# Patient Record
Sex: Female | Born: 1995 | Race: White | Hispanic: Yes | Marital: Married | Smoking: Never smoker
Health system: Southern US, Community
[De-identification: ages and names within clinical notes are randomized; demographics above are authoritative.]

## PROBLEM LIST (undated history)

## (undated) DIAGNOSIS — Z98891 History of uterine scar from previous surgery: Secondary | ICD-10-CM

## (undated) DIAGNOSIS — E58 Dietary calcium deficiency: Secondary | ICD-10-CM

## (undated) DIAGNOSIS — R0689 Other abnormalities of breathing: Secondary | ICD-10-CM

## (undated) DIAGNOSIS — O24419 Gestational diabetes mellitus in pregnancy, unspecified control: Secondary | ICD-10-CM

---

## 2015-03-16 DIAGNOSIS — Z98891 History of uterine scar from previous surgery: Secondary | ICD-10-CM | POA: Insufficient documentation

## 2018-01-25 ENCOUNTER — Inpatient Hospital Stay (HOSPITAL_BASED_OUTPATIENT_CLINIC_OR_DEPARTMENT_OTHER): Payer: Self-pay

## 2018-01-25 ENCOUNTER — Encounter (HOSPITAL_COMMUNITY): Payer: Self-pay

## 2018-01-25 ENCOUNTER — Inpatient Hospital Stay (HOSPITAL_COMMUNITY)
Admission: AD | Admit: 2018-01-25 | Discharge: 2018-01-25 | Disposition: A | Discharge status: Home / Self Care | Payer: Self-pay | Source: Ambulatory Visit | Attending: Obstetrics & Gynecology | Admitting: Obstetrics & Gynecology

## 2018-01-25 DIAGNOSIS — O34219 Maternal care for unspecified type scar from previous cesarean delivery: Secondary | ICD-10-CM

## 2018-01-25 DIAGNOSIS — O368130 Decreased fetal movements, third trimester, not applicable or unspecified: Secondary | ICD-10-CM

## 2018-01-25 DIAGNOSIS — Z3A38 38 weeks gestation of pregnancy: Secondary | ICD-10-CM

## 2018-01-25 DIAGNOSIS — Z363 Encounter for antenatal screening for malformations: Secondary | ICD-10-CM

## 2018-01-25 DIAGNOSIS — O368190 Decreased fetal movements, unspecified trimester, not applicable or unspecified: Secondary | ICD-10-CM

## 2018-01-25 DIAGNOSIS — O0930 Supervision of pregnancy with insufficient antenatal care, unspecified trimester: Secondary | ICD-10-CM

## 2018-01-25 DIAGNOSIS — O0933 Supervision of pregnancy with insufficient antenatal care, third trimester: Secondary | ICD-10-CM

## 2018-01-25 HISTORY — DX: Dietary calcium deficiency: E58

## 2018-01-25 LAB — URINALYSIS, ROUTINE W REFLEX MICROSCOPIC
Bilirubin Urine: NEGATIVE
GLUCOSE, UA: NEGATIVE mg/dL
Hgb urine dipstick: NEGATIVE
Ketones, ur: NEGATIVE mg/dL
Nitrite: NEGATIVE
Protein, ur: NEGATIVE mg/dL
SPECIFIC GRAVITY, URINE: 1.01 (ref 1.005–1.030)
pH: 7 (ref 5.0–8.0)

## 2018-01-25 LAB — OB RESULTS CONSOLE GBS: STREP GROUP B AG: NEGATIVE

## 2018-01-25 MED ORDER — SALINE SPRAY 0.65 % NA SOLN
1.0000 | NASAL | Status: DC | PRN
  Administered 2018-01-25: 1 via NASAL
  Filled 2018-01-25: qty 44

## 2018-01-25 NOTE — MAU Note (Signed)
Pt with dec FM x past 3 days. Pt was seen in another hospital 3 days ago with dec FM and cxts in [place] (pt unsure of hospital name). No vag bldg or watery leakage. Pain 5/10 in lower abd and lower back.   Adah Perlhandra Zoe Creasman RN

## 2018-01-25 NOTE — Discharge Instructions (Signed)
Evaluacin de los movimientos fetales  Fetal Movement Counts  Introduccin  Nombre del paciente: ________________________________________________ Fecha de parto estimada: ____________________  Qu es una evaluacin de los movimientos fetales?  Una evaluacin de los movimientos fetales es el registro del nmero de veces que siente que el beb se mueve durante un cierto perodo de tiempo. Esto tambin se puede denominar recuento de patadas fetales. Una evaluacin de movimientos fetales se recomienda a todas las embarazadas. Es posible que le indiquen que comience a evaluar los movimientos fetales desde la semana 28 de embarazo.  Preste atencin cuando sienta que el beb est ms activo. Podr detectar los ciclos en que el beb duerme y est despierto. Tambin podr detectar que ciertas cosas hacen que su beb se mueva ms. Deber realizar una evaluacin de los movimientos fetales en las siguientes situaciones:   Cuando el beb est ms activo habitualmente.   A la misma hora, todos los das.    Un buen momento para evaluar los movimientos fetales es cuando est descansando, despus de haber comido y bebido algo.  Cmo debo contar los movimientos fetales?  1. Encuentre un lugar tranquilo y cmodo. Sintese o acustese de lado.  2. Anote la fecha, la hora de inicio y de finalizacin y la cantidad de movimientos que sinti entre esas dos horas. Lleve esta informacin a las visitas de control.  3. Cuente las pataditas, revoloteos, chasquidos, vueltas o pinchazos en un perodo de 2horas. Debe sentir al menos 10movimientos en 2horas.  4. Cuando sienta 10movimientos, puede dejar de contar.  5. Si no siente 10movimientos en 2horas, coma y beba algo. Luego, contine descansando y contando durante 1hora. Si siente al menos 4movimientos durante esa hora, puede dejar de contar.  Comunquese con un mdico si:   Siente menos de 4movimientos en 2horas.   El beb no se mueve tanto como suele hacerlo.  Fecha:  ____________ Hora de inicio: ____________ Hora de finalizacin: ____________ Movimientos: ____________  Fecha: ____________ Hora de inicio: ____________ Hora de finalizacin: ____________ Movimientos: ____________  Fecha: ____________ Hora de inicio: ____________ Hora de finalizacin: ____________ Movimientos: ____________  Fecha: ____________ Hora de inicio: ____________ Hora de finalizacin: ____________ Movimientos: ____________  Fecha: ____________ Hora de inicio: ____________ Hora de finalizacin: ____________ Movimientos: ____________  Fecha: ____________ Hora de inicio: ____________ Hora de finalizacin: ____________ Movimientos: ____________  Fecha: ____________ Hora de inicio: ____________ Hora de finalizacin: ____________ Movimientos: ____________  Fecha: ____________ Hora de inicio: ____________ Hora de finalizacin: ____________ Movimientos: ____________  Fecha: ____________ Hora de inicio: ____________ Hora de finalizacin: ____________ Movimientos: ____________  Esta informacin no tiene como fin reemplazar el consejo del mdico. Asegrese de hacerle al mdico cualquier pregunta que tenga.  Document Released: 06/08/2007 Document Revised: 06/04/2016 Document Reviewed: 04/10/2015  Elsevier Interactive Patient Education  2018 [employer]vier Inc.

## 2018-01-25 NOTE — MAU Provider Note (Signed)
History     CSN: [id number]  Arrival date and time: 01/25/18 [id number]   First Provider Initiated Contact with Patient 01/25/18 1715      Chief Complaint  Patient presents with  . Decreased Fetal Movement   G2P1001 @38 .5 wks here with decreased FM x2 days. She is feeling some FM. Denies VB, LOF, and reg ctx. Has not had any PNC. [place] about 3 mos ago and was arrested under immigration. Was released yesterday. She is now residing here in [place]. Reports first delivery was CS for low fluid.    OB History    Gravida  2   Para  1   Term  1   Preterm      AB      Living  1     SAB      TAB      Ectopic      Multiple      Live Births  1           Past Medical History:  Diagnosis Date  . Calcium deficiency     Past Surgical History:  Procedure Laterality Date  . CESAREAN SECTION      Family History  Problem Relation Age of Onset  . Diabetes Paternal Grandfather     Social History   Tobacco Use  . Smoking status: Never Smoker  . Smokeless tobacco: Never Used  Substance Use Topics  . Alcohol use: Never    Frequency: Never  . Drug use: Never    Allergies: Not on File  No medications prior to admission.    Review of Systems  HENT: Positive for congestion (nasal).   Gastrointestinal: Negative for abdominal pain.  Genitourinary: Negative for vaginal bleeding and vaginal discharge.   Physical Exam   Blood pressure 129/68, pulse 99, temperature 97.9 F (36.6 C), temperature source Oral, resp. rate 16, last menstrual period 04/29/2017, SpO2 99 %.  Physical Exam  Constitutional: She is oriented to person, place, and time. She appears well-developed and well-nourished. No distress.  HENT:  Head: Normocephalic and atraumatic.  Neck: Normal range of motion.  Respiratory: Effort normal. No respiratory distress.  GI: Soft. She exhibits no distension. There is no tenderness.  gravid  Musculoskeletal: Normal range of motion.   Neurological: She is alert and oriented to person, place, and time.  Skin: Skin is warm and dry.  Psychiatric: She has a normal mood and affect.  EFM: 140 bpm, mod variability, + accels, no decels Toco: rare  Results for orders placed or performed during the hospital encounter of 01/25/18 (from the past 24 hour(s))  Urinalysis, Routine w reflex microscopic     Status: Abnormal   Collection Time: 01/25/18  4:49 PM  Result Value Ref Range   Color, Urine YELLOW YELLOW   APPearance CLOUDY (A) CLEAR   Specific Gravity, Urine 1.010 1.005 - 1.030   pH 7.0 5.0 - 8.0   Glucose, UA NEGATIVE NEGATIVE mg/dL   Hgb urine dipstick NEGATIVE NEGATIVE   Bilirubin Urine NEGATIVE NEGATIVE   Ketones, ur NEGATIVE NEGATIVE mg/dL   Protein, ur NEGATIVE NEGATIVE mg/dL   Nitrite NEGATIVE NEGATIVE   Leukocytes, UA TRACE (A) NEGATIVE   RBC / HPF 0-5 0 - 5 RBC/hpf   WBC, UA 6-10 0 - 5 WBC/hpf   Bacteria, UA RARE (A) NONE SEEN   Squamous Epithelial / LPF 0-5 0 - 5   Amorphous Crystal PRESENT    MAU Course  Procedures  MDM BPP 8/8, AFI 14cm. Normal anatomy. Measuring 35.6 by [place] discordant with LMP dating. Pt is sure of LMP. Discussed need for records specifically Op note to determine best MOD. Release of information signed. Feeling more FM now, FM visualized. Stable for discharge home.   Assessment and Plan   1. [redacted] weeks gestation of pregnancy   2. Decreased fetal movement   3. No prenatal care in current pregnancy    Discharge home Follow up in [facility] tomorrow to start care [facility] Labor precautions  Allergies as of 01/25/2018   Not on File     Medication List    You have not been prescribed any medications.    Live interpreter present for encounter  Donette Larry, CNM 01/25/2018, 5:43 PM

## 2018-01-26 ENCOUNTER — Encounter: Payer: Self-pay | Admitting: Family Medicine

## 2018-01-26 ENCOUNTER — Ambulatory Visit (INDEPENDENT_AMBULATORY_CARE_PROVIDER_SITE_OTHER): Payer: Self-pay | Admitting: Family Medicine

## 2018-01-26 VITALS — BP 111/68 | Ht 66.54 in | Wt 212.4 lb

## 2018-01-26 DIAGNOSIS — Z113 Encounter for screening for infections with a predominantly sexual mode of transmission: Secondary | ICD-10-CM

## 2018-01-26 DIAGNOSIS — Z1389 Encounter for screening for other disorder: Secondary | ICD-10-CM

## 2018-01-26 DIAGNOSIS — Z124 Encounter for screening for malignant neoplasm of cervix: Secondary | ICD-10-CM

## 2018-01-26 DIAGNOSIS — Z789 Other specified health status: Secondary | ICD-10-CM | POA: Insufficient documentation

## 2018-01-26 DIAGNOSIS — O0933 Supervision of pregnancy with insufficient antenatal care, third trimester: Secondary | ICD-10-CM

## 2018-01-26 DIAGNOSIS — Z3493 Encounter for supervision of normal pregnancy, unspecified, third trimester: Secondary | ICD-10-CM

## 2018-01-26 DIAGNOSIS — O34219 Maternal care for unspecified type scar from previous cesarean delivery: Secondary | ICD-10-CM

## 2018-01-26 DIAGNOSIS — Z3490 Encounter for supervision of normal pregnancy, unspecified, unspecified trimester: Secondary | ICD-10-CM | POA: Insufficient documentation

## 2018-01-26 DIAGNOSIS — Z23 Encounter for immunization: Secondary | ICD-10-CM

## 2018-01-26 DIAGNOSIS — Z331 Pregnant state, incidental: Secondary | ICD-10-CM

## 2018-01-26 DIAGNOSIS — Z3A38 38 weeks gestation of pregnancy: Secondary | ICD-10-CM

## 2018-01-26 LAB — POCT URINALYSIS DIP (DEVICE)
Bilirubin Urine: NEGATIVE
Glucose, UA: NEGATIVE mg/dL
Ketones, ur: NEGATIVE mg/dL
Nitrite: NEGATIVE
Protein, ur: NEGATIVE mg/dL
Specific Gravity, Urine: 1.015 (ref 1.005–1.030)
Urobilinogen, UA: 0.2 mg/dL (ref 0.0–1.0)
pH: 7 (ref 5.0–8.0)

## 2018-01-26 NOTE — Patient Instructions (Addendum)
 Lactancia materna Breastfeeding Decidir amamantar es una de las mejores elecciones que puede hacer por usted y su beb. Un cambio en las hormonas durante el embarazo hace que las mamas produzcan leche materna en las glndulas productoras de leche. Las hormonas impiden que la leche materna sea liberada antes del nacimiento del beb. Adems, impulsan el flujo de leche luego del nacimiento. Una vez que ha comenzado a amamantar, pensar en el beb, as como la succin o el llanto, pueden estimular la liberacin de leche de las glndulas productoras de leche. Los beneficios de amamantar Las investigaciones demuestran que la lactancia materna ofrece muchos beneficios de salud para bebs y madres. Adems, ofrece una forma gratuita y conveniente de alimentar al beb. Para el beb  La primera leche (calostro) ayuda a mejorar el funcionamiento del aparato digestivo del beb.  Las clulas especiales de la leche (anticuerpos) ayudan a combatir las infecciones en el beb.  Los bebs que se alimentan con leche materna tambin tienen menos probabilidades de tener asma, alergias, obesidad o diabetes de tipo 2. Adems, tienen menor riesgo de sufrir el sndrome de muerte sbita del lactante (SMSL).  Los nutrientes de la leche materna son mejores para satisfacer las necesidades del beb en comparacin con la leche maternizada.  La leche materna mejora el desarrollo cerebral del beb. Para usted  La lactancia materna favorece el desarrollo de un vnculo muy especial entre la madre y el beb.  Es conveniente. La leche materna es econmica y siempre est disponible a la temperatura correcta.  La lactancia materna ayuda a quemar caloras. Le ayuda a perder el peso ganado durante el embarazo.  Hace que el tero vuelva al tamao que tena antes del embarazo ms rpido. Adems, disminuye el sangrado (loquios) despus del parto.  La lactancia materna contribuye a reducir el riesgo de tener diabetes de tipo 2,  osteoporosis, artritis reumatoide, enfermedades cardiovasculares y cncer de mama, ovario, tero y endometrio en el futuro. Informacin bsica sobre la lactancia Comienzo de la lactancia  Encuentre un lugar cmodo para sentarse o acostarse, con un buen respaldo para el cuello y la espalda.  Coloque una almohada o una manta enrollada debajo del beb para acomodarlo a la altura de la mama (si est sentada). Las almohadas para amamantar se han diseado especialmente a fin de servir de apoyo para los brazos y el beb mientras amamanta.  Asegrese de que la barriga del beb (abdomen) est frente a la suya.  Masajee suavemente la mama. Con las yemas de los dedos, masajee los bordes exteriores de la mama hacia adentro, en direccin al pezn. Esto estimula el flujo de leche. Si la leche fluye lentamente, es posible que deba continuar con este movimiento durante la lactancia.  Sostenga la mama con 4 dedos por debajo y el pulgar por arriba del pezn (forme la letra "C" con la mano). Asegrese de que los dedos se encuentren lejos del pezn y de la boca del beb.  Empuje suavemente los labios del beb con el pezn o con el dedo.  Cuando la boca del beb se abra lo suficiente, acrquelo rpidamente a la mama e introduzca todo el pezn y la arola, tanto como sea posible, dentro de la boca del beb. La arola es la zona de color que rodea al pezn. ? Debe haber ms arola visible por arriba del labio superior del beb que por debajo del labio inferior. ? Los labios del beb deben estar abiertos y extendidos hacia afuera (evertidos) para asegurar que   el beb se prenda de forma adecuada y cmoda. ? La lengua del beb debe estar entre la enca inferior y la mama.  Asegrese de que la boca del beb est en la posicin correcta alrededor del pezn (prendido). Los labios del beb deben crear un sello sobre la mama y estar doblados hacia afuera (invertidos).  Es comn que el beb succione durante 2 a 3 minutos  para que comience el flujo de leche materna. Cmo debe prenderse Es muy importante que le ensee al beb cmo prenderse adecuadamente a la mama. Si el beb no se prende adecuadamente, puede causar dolor en los pezones, reducir la produccin de leche materna y hacer que el beb tenga un escaso aumento de peso. Adems, si el beb no se prende adecuadamente al pezn, puede tragar aire durante la alimentacin. Esto puede causarle molestias al beb. Hacer eructar al beb al cambiar de mama puede ayudarlo a liberar el aire. Sin embargo, ensearle al beb cmo prenderse a la mama adecuadamente es la mejor manera de evitar que se sienta molesto por tragar aire mientras se alimenta. Signos de que el beb se ha prendido adecuadamente al pezn  Tironea o succiona de modo silencioso, sin causarle dolor. Los labios del beb deben estar extendidos hacia afuera (evertidos).  Se escucha que traga cada 3 o 4 succiones una vez que la leche ha comenzado a fluir (despus de que se produzca el reflejo de eyeccin de la leche).  Hay movimientos musculares por arriba y por delante de sus odos al succionar.  Signos de que el beb no se ha prendido adecuadamente al pezn  Hace ruidos de succin o de chasquido mientras se alimenta.  Siente dolor en los pezones.  Si cree que el beb no se prendi correctamente, deslice el dedo en la comisura de la boca y colquelo entre las encas del beb para interrumpir la succin. Intente volver a comenzar a amamantar. Signos de lactancia materna exitosa Signos del beb  El beb disminuir gradualmente el nmero de succiones o dejar de succionar por completo.  El beb se quedar dormido.  El cuerpo del beb se relajar.  El beb retendr una pequea cantidad de leche en la boca.  El beb se desprender solo del pecho.  Signos que presenta usted  Las mamas han aumentado la firmeza, el peso y el tamao 1 a 3 horas despus de amamantar.  Estn ms blandas inmediatamente  despus de amamantar.  Se producen un aumento del volumen de leche y un cambio en su consistencia y color hacia el quinto da de lactancia.  Los pezones no duelen, no estn agrietados ni sangran.  Signos de que su beb recibe la cantidad de leche suficiente  Mojar por lo menos 1 o 2paales durante las primeras 24horas despus del nacimiento.  Mojar por lo menos 5 o 6paales cada 24horas durante la primera semana despus del nacimiento. La orina debe ser clara o de color amarillo plido a los 5das de vida.  Mojar entre 6 y 8paales cada 24horas a medida que el beb sigue creciendo y desarrollndose.  Defeca por lo menos 3 veces en 24 horas a los 5 das de vida. Las heces deben ser blandas y amarillentas.  Defeca por lo menos 3 veces en 24 horas a los 7 das de vida. Las heces deben ser grumosas y amarillentas.  No registra una prdida de peso mayor al 10% del peso al nacer durante los primeros 3 das de vida.  Aumenta de peso un   promedio de 4 a 7onzas (113 a 198g) por semana despus de los 4 das de vida.  Aumenta de peso, diariamente, de manera uniforme a partir de los 5 das de vida, sin registrar prdida de peso despus de las 2semanas de vida. Despus de alimentarse, es posible que el beb regurgite una pequea cantidad de leche. Esto es normal. Frecuencia y duracin de la lactancia El amamantamiento frecuente la ayudar a producir ms leche y puede prevenir dolores en los pezones y las mamas extremadamente llenas (congestin mamaria). Alimente al beb cuando muestre signos de hambre o si siente la necesidad de reducir la congestin de las mamas. Esto se denomina "lactancia a demanda". Las seales de que el beb tiene hambre incluyen las siguientes:  Aumento del estado de alerta, actividad o inquietud.  Mueve la cabeza de un lado a otro.  Abre la boca cuando se le toca la mejilla o la comisura de la boca (reflejo de bsqueda).  Aumenta las vocalizaciones, tales como  sonidos de succin, se relame los labios, emite arrullos, suspiros o chirridos.  Mueve la mano hacia la boca y se chupa los dedos o las manos.  Est molesto o llora.  Evite el uso del chupete en las primeras 4 a 6 semanas despus del nacimiento del beb. Despus de este perodo, podr usar un chupete. Las investigaciones demostraron que el uso del chupete durante el primer ao de vida del beb disminuye el riesgo de tener el sndrome de muerte sbita del lactante (SMSL). Permita que el nio se alimente en cada mama todo lo que desee. Cuando el beb se desprende o se queda dormido mientras se est alimentando de la primera mama, ofrzcale la segunda. Debido a que, con frecuencia, los recin nacidos estn somnolientos las primeras semanas de vida, es posible que deba despertar al beb para alimentarlo. Los horarios de lactancia varan de un beb a otro. Sin embargo, las siguientes reglas pueden servir como gua para ayudarla a garantizar que el beb se alimenta adecuadamente:  Se puede amamantar a los recin nacidos (bebs de 4 semanas o menos de vida) cada 1 a 3 horas.  No deben transcurrir ms de 3 horas durante el da o 5 horas durante la noche sin que se amamante a los recin nacidos.  Debe amamantar al beb un mnimo de 8 veces en un perodo de 24 horas.  Extraccin de leche materna La extraccin y el almacenamiento de la leche materna le permiten asegurarse de que el beb se alimente exclusivamente de su leche materna, aun en momentos en los que no puede amamantar. Esto tiene especial importancia si debe regresar al trabajo en el perodo en que an est amamantando o si no puede estar presente en los momentos en que el beb debe alimentarse. Su asesor en lactancia puede ayudarla a encontrar un mtodo de extraccin que funcione mejor para usted y orientarla sobre cunto tiempo es seguro almacenar leche materna. Cmo cuidar las mamas durante la lactancia Los pezones pueden secarse, agrietarse y  doler durante la lactancia. Las siguientes recomendaciones pueden ayudarla a mantener las mamas humectadas y sanas:  Evite usar jabn en los pezones.  Use un sostn de soporte diseado especialmente para la lactancia materna. Evite usar sostenes con aro o sostenes muy ajustados (sostenes deportivos).  Seque al aire sus pezones durante 3 a 4minutos despus de amamantar al beb.  Utilice solo apsitos de algodn en el sostn para absorber las prdidas de leche. La prdida de un poco de leche materna   entre las tomas es normal.  Utilice lanolina sobre los pezones luego de amamantar. La lanolina ayuda a mantener la humedad normal de la piel. La lanolina pura no es perjudicial (no es txica) para el beb. Adems, puede extraer manualmente algunas gotas de leche materna y masajear suavemente esa leche sobre los pezones para que la leche se seque al aire.  Durante las primeras semanas despus del nacimiento, algunas mujeres experimentan congestin mamaria. La congestin mamaria puede hacer que sienta las mamas pesadas, calientes y sensibles al tacto. El pico de la congestin mamaria ocurre en el plazo de los 3 a 5 das despus del parto. Las siguientes recomendaciones pueden ayudarla a aliviar la congestin mamaria:  Vace por completo las mamas al amamantar o extraer leche. Puede aplicar calor hmedo en las mamas (en la ducha o con toallas hmedas para manos) antes de amamantar o extraer leche. Esto aumenta la circulacin y ayuda a que la leche fluya. Si el beb no vaca por completo las mamas cuando lo amamanta, extraiga la leche restante despus de que haya finalizado.  Aplique compresas de hielo sobre las mamas inmediatamente despus de amamantar o extraer leche, a menos que le resulte demasiado incmodo. Haga lo siguiente: ? Ponga el hielo en una bolsa plstica. ? Coloque una toalla entre la piel y la bolsa de hielo. ? Coloque el hielo durante 20minutos, 2 o 3veces por da.  Asegrese de que el  beb est prendido y se encuentre en la posicin correcta mientras lo alimenta.  Si la congestin mamaria persiste luego de 48 horas o despus de seguir estas recomendaciones, comunquese con su mdico o un asesor en lactancia. Recomendaciones de salud general durante la lactancia  Consuma 3 comidas y 3 colaciones saludables todos los das. Las madres bien alimentadas que amamantan necesitan entre 450 y 500 caloras adicionales por da. Puede cumplir con este requisito al aumentar la cantidad de una dieta equilibrada que realice.  Beba suficiente agua para mantener la orina clara o de color amarillo plido.  Descanse con frecuencia, reljese y siga tomando sus vitaminas prenatales para prevenir la fatiga, el estrs y los niveles bajos de vitaminas y minerales en el cuerpo (deficiencias de nutrientes).  No consuma ningn producto que contenga nicotina o tabaco, como cigarrillos y cigarrillos electrnicos. El beb puede verse afectado por las sustancias qumicas de los cigarrillos que pasan a la leche materna y por la exposicin al humo ambiental del tabaco. Si necesita ayuda para dejar de fumar, consulte al mdico.  Evite el consumo de alcohol.  No consuma drogas ilegales o marihuana.  Antes de usar cualquier medicamento, hable con el mdico. Estos incluyen medicamentos recetados y de venta libre, como tambin vitaminas y suplementos a base de hierbas. Algunos medicamentos, que pueden ser perjudiciales para el beb, pueden pasar a travs de la leche materna.  Puede quedar embarazada durante la lactancia. Si se desea un mtodo anticonceptivo, consulte al mdico sobre cules son las opciones seguras durante la lactancia. Dnde encontrar ms informacin: Liga internacional [place]eche: www.llli.org. Comunquese con un mdico si:  Siente que quiere dejar de amamantar o se siente frustrada con la lactancia.  Sus pezones estn agrietados o sangran.  Sus mamas estn irritadas, sensibles o  calientes.  Tiene los siguientes sntomas: ? Dolor en las mamas o en los pezones. ? Un rea hinchada en cualquiera de las mamas. ? Fiebre o escalofros. ? Nuseas o vmitos. ? Drenaje de otro lquido distinto de la leche materna desde los pezones.    Sus mamas no se llenan antes de amamantar al beb para el quinto da despus del parto.  Se siente triste y deprimida.  El beb: ? Est demasiado somnoliento como para comer bien. ? Tiene problemas para dormir. ? Tiene ms de 1 semana de vida y moja menos de 6 paales en un periodo de 24 horas. ? No ha aumentado de peso a los 5 das de vida.  El beb defeca menos de 3 veces en 24 horas.  La piel del beb o las partes blancas de los ojos se vuelven amarillentas. Solicite ayuda de inmediato si:  El beb est muy cansado (letargo) y no se quiere despertar para comer.  Le sube la fiebre sin causa. Resumen  La lactancia materna ofrece muchos beneficios de salud para bebs y madres.  Intente amamantar a su beb cuando muestre signos tempranos de hambre.  Haga cosquillas o empuje suavemente los labios del beb con el dedo o el pezn para lograr que el beb abra la boca. Acerque el beb a la mama. Asegrese de que la mayor parte de la arola se encuentre dentro de la boca del beb. Ofrzcale una mama y haga eructar al beb antes de pasar a la otra.  Hable con su mdico o asesor en lactancia si tiene dudas o problemas con la lactancia. Esta informacin no tiene como fin reemplazar el consejo del mdico. Asegrese de hacerle al mdico cualquier pregunta que tenga. Document Released: 03/01/2005 Document Revised: 06/21/2016 Document Reviewed: 06/21/2016 Elsevier Interactive Patient Education  2018 [employer]vier Inc.  

## 2018-01-26 NOTE — Progress Notes (Addendum)
Subjective:   Shelley Wiggins is a 22 y.o. G2P1001 at 5878w6d by LMP, and 3rd trimester u/s being seen today for her first obstetrical visit.  Her obstetrical history is significant for previous c-section. Patient does intend to breast feed. Pregnancy history fully reviewed.  Patient reports no complaints.  HISTORY: OB History  Gravida Para Term Preterm AB Living  2 1 1  0 0 1  SAB TAB Ectopic Multiple Live Births  0 0 0 0 1    # Outcome Date GA Lbr Len/2nd Weight Sex Delivery Anes PTL Lv  2 Current           1 Term 02/27/16 2422w0d  7 lb 5 oz (3.317 kg) F CS-LTranv  N LIV     Complications: Oligohydramnios   Past Medical History:  Diagnosis Date  . Calcium deficiency    Past Surgical History:  Procedure Laterality Date  . CESAREAN SECTION     Family History  Problem Relation Age of Onset  . Diabetes Paternal Grandmother   . Diabetes Father   . Diabetes Paternal Aunt    Social History   Tobacco Use  . Smoking status: Never Smoker  . Smokeless tobacco: Never Used  Substance Use Topics  . Alcohol use: Never    Frequency: Never  . Drug use: Never   Not on File Current Outpatient Medications on File Prior to Visit  Medication Sig Dispense Refill  . prenatal vitamin w/FE, FA (PRENATAL 1 + 1) 27-1 MG TABS tablet Take 1 tablet by mouth daily at 12 noon.    . sodium chloride (OCEAN) 0.65 % SOLN nasal spray Place 1 spray into both nostrils as needed for congestion.     No current facility-administered medications on file prior to visit.      Exam   Vitals:   01/26/18 1447 01/26/18 1451  BP: 111/68   Weight: 212 lb 6.4 oz (96.3 kg)   Height:  5' 6.54" (1.69 m)   Fetal Heart Rate (bpm): 140  Uterus:  Fundal Height: 32 cm  Pelvic Exam: Perineum: no hemorrhoids, normal perineum   Vulva: normal external genitalia, no lesions   Vagina:  normal mucosa, normal discharge   Cervix: no lesions and normal, pap smear done.  Dilation: 1 Effacement (%): 50 Station:  -2 Presentation: Vertex    Adnexa: normal adnexa and no mass, fullness, tenderness   Bony Pelvis: average  System: General: well-developed, well-nourished female in no acute distress   Breast:  normal appearance, no masses or tenderness   Skin: normal coloration and turgor, no rashes   Neurologic: oriented, normal, negative, normal mood   Extremities: normal strength, tone, and muscle mass, ROM of all joints is normal   HEENT extraocular movement intact and sclera clear, anicteric   Mouth/Teeth mucous membranes moist, pharynx normal without lesions and dental hygiene good   Neck supple and no masses   Cardiovascular: regular rate and rhythm   Respiratory:  no respiratory distress, normal breath sounds   Abdomen: soft, non-tender; bowel sounds normal; no masses,  no organomegaly     Assessment:   Pregnancy: G2P1001 Patient Active Problem List   Diagnosis Date Noted  . Supervision of low-risk pregnancy 01/26/2018  . Previous cesarean delivery affecting pregnancy, antepartum 01/26/2018  . Language barrier 01/26/2018  . Late prenatal care affecting pregnancy, antepartum, third trimester 01/26/2018     Plan:  1. Encounter for supervision of low-risk pregnancy in third trimester  - Culture, OB Urine - Hemoglobinopathy  Evaluation - Obstetric Panel, Including HIV - Glucose Tolerance, 1 Hour - Cytology - PAP  2. Previous cesarean delivery affecting pregnancy, antepartum Term and vertex--reasonably sure she has a LTCS--risks reviewed of RCS vs. TOLAC--desires TOLAC--consent signed  3. Language barrier Spanish interpreter: Mariel used   4. Late prenatal care affecting pregnancy, antepartum, third trimester Very late to care, has sure LMP and u/s agrees within 3 wks, so LMP dating used--check labs wit 1 hour today Flu and TDaP today  Initial labs drawn. Continue prenatal vitamins. Genetic Screening discussed, too late:  Ultrasound discussed; fetal anatomic survey: results  reviewed. Problem list reviewed and updated. The nature of [facility] with multiple MDs and other Advanced Practice Providers was explained to patient; also emphasized that residents, students are part of our team. Routine obstetric precautions reviewed. Return in 1 week (on 02/02/2018).

## 2018-01-26 NOTE — Progress Notes (Signed)
New ob packet given   

## 2018-01-29 LAB — CULTURE, OB URINE

## 2018-01-29 LAB — URINE CULTURE, OB REFLEX: Organism ID, Bacteria: NO GROWTH

## 2018-01-30 LAB — GLUCOSE TOLERANCE, 1 HOUR: Glucose, 1Hr PP: 100 mg/dL (ref 65–199)

## 2018-01-30 LAB — HEMOGLOBINOPATHY EVALUATION
FERRITIN: 35 ng/mL (ref 15–150)
HGB A2 QUANT: 2.2 % (ref 1.8–3.2)
HGB C: 0 %
HGB S: 0 %
Hgb A: 97.8 % (ref 96.4–98.8)
Hgb F Quant: 0 % (ref 0.0–2.0)
Hgb Solubility: NEGATIVE
Hgb Variant: 0 %

## 2018-01-30 LAB — OBSTETRIC PANEL, INCLUDING HIV
Antibody Screen: NEGATIVE
Basophils Absolute: 0 10*3/uL (ref 0.0–0.2)
Basos: 0 %
EOS (ABSOLUTE): 0.2 10*3/uL (ref 0.0–0.4)
Eos: 2 %
HIV Screen 4th Generation wRfx: NONREACTIVE
Hematocrit: 39.5 % (ref 34.0–46.6)
Hemoglobin: 13.3 g/dL (ref 11.1–15.9)
Hepatitis B Surface Ag: NEGATIVE
Immature Grans (Abs): 0.1 10*3/uL (ref 0.0–0.1)
Immature Granulocytes: 1 %
Lymphocytes Absolute: 2 10*3/uL (ref 0.7–3.1)
Lymphs: 20 %
MCH: 29.1 pg (ref 26.6–33.0)
MCHC: 33.7 g/dL (ref 31.5–35.7)
MCV: 86 fL (ref 79–97)
Monocytes Absolute: 0.7 10*3/uL (ref 0.1–0.9)
Monocytes: 7 %
Neutrophils Absolute: 6.9 10*3/uL (ref 1.4–7.0)
Neutrophils: 70 %
Platelets: 274 10*3/uL (ref 150–450)
RBC: 4.57 x10E6/uL (ref 3.77–5.28)
RDW: 13.5 % (ref 12.3–15.4)
RPR Ser Ql: NONREACTIVE
Rh Factor: POSITIVE
Rubella Antibodies, IGG: 4.61 index (ref >0.99)
WBC: 9.8 10*3/uL (ref 3.4–10.8)

## 2018-01-30 LAB — CYTOLOGY - PAP
Chlamydia: NEGATIVE
Diagnosis: NEGATIVE
Neisseria Gonorrhea: NEGATIVE

## 2018-01-30 LAB — CULTURE, BETA STREP (GROUP B ONLY)

## 2018-02-02 ENCOUNTER — Encounter: Payer: Self-pay | Admitting: Obstetrics and Gynecology

## 2018-02-03 ENCOUNTER — Inpatient Hospital Stay (HOSPITAL_COMMUNITY)
Admission: AD | Admit: 2018-02-03 | Discharge: 2018-02-06 | DRG: 788 | Disposition: A | Discharge status: Home / Self Care | Payer: Medicaid Other | Attending: Obstetrics and Gynecology | Admitting: Obstetrics and Gynecology

## 2018-02-03 ENCOUNTER — Encounter (HOSPITAL_COMMUNITY): Payer: Self-pay | Admitting: *Deleted

## 2018-02-03 ENCOUNTER — Ambulatory Visit (INDEPENDENT_AMBULATORY_CARE_PROVIDER_SITE_OTHER): Payer: Self-pay | Admitting: Student

## 2018-02-03 VITALS — BP 133/58 | HR 89 | Wt 215.8 lb

## 2018-02-03 DIAGNOSIS — O368130 Decreased fetal movements, third trimester, not applicable or unspecified: Secondary | ICD-10-CM

## 2018-02-03 DIAGNOSIS — O34219 Maternal care for unspecified type scar from previous cesarean delivery: Secondary | ICD-10-CM

## 2018-02-03 DIAGNOSIS — O0933 Supervision of pregnancy with insufficient antenatal care, third trimester: Secondary | ICD-10-CM

## 2018-02-03 DIAGNOSIS — O34211 Maternal care for low transverse scar from previous cesarean delivery: Secondary | ICD-10-CM | POA: Diagnosis present

## 2018-02-03 DIAGNOSIS — Z3A40 40 weeks gestation of pregnancy: Secondary | ICD-10-CM | POA: Diagnosis not present

## 2018-02-03 DIAGNOSIS — Z789 Other specified health status: Secondary | ICD-10-CM

## 2018-02-03 DIAGNOSIS — O368190 Decreased fetal movements, unspecified trimester, not applicable or unspecified: Secondary | ICD-10-CM | POA: Diagnosis present

## 2018-02-03 DIAGNOSIS — O480 Post-term pregnancy: Secondary | ICD-10-CM | POA: Diagnosis not present

## 2018-02-03 DIAGNOSIS — Z3493 Encounter for supervision of normal pregnancy, unspecified, third trimester: Secondary | ICD-10-CM

## 2018-02-03 HISTORY — DX: History of uterine scar from previous surgery: Z98.891

## 2018-02-03 LAB — TYPE AND SCREEN
ABO/RH(D): O POS
Antibody Screen: NEGATIVE

## 2018-02-03 LAB — CBC
HEMATOCRIT: 40.7 % (ref 36.0–46.0)
HEMOGLOBIN: 13.7 g/dL (ref 12.0–15.0)
MCH: 29.8 pg (ref 26.0–34.0)
MCHC: 33.7 g/dL (ref 30.0–36.0)
MCV: 88.7 fL (ref 80.0–100.0)
Platelets: 269 10*3/uL (ref 150–400)
RBC: 4.59 MIL/uL (ref 3.87–5.11)
RDW: 13.9 % (ref 11.5–15.5)
WBC: 12.3 10*3/uL — ABNORMAL HIGH (ref 4.0–10.5)
nRBC: 0 % (ref 0.0–0.2)

## 2018-02-03 LAB — ABO/RH: ABO/RH(D): O POS

## 2018-02-03 MED ORDER — ZOLPIDEM TARTRATE 5 MG PO TABS: 5.0000 mg | ORAL_TABLET | Freq: Every evening | ORAL | Status: DC | PRN

## 2018-02-03 MED ORDER — OXYCODONE-ACETAMINOPHEN 5-325 MG PO TABS: 1.0000 | ORAL_TABLET | ORAL | Status: DC | PRN

## 2018-02-03 MED ORDER — LACTATED RINGERS IV SOLN: 500.0000 mL | INTRAVENOUS | Status: DC | PRN

## 2018-02-03 MED ORDER — OXYTOCIN BOLUS FROM INFUSION: 500.0000 mL | Freq: Once | INTRAVENOUS | Status: DC

## 2018-02-03 MED ORDER — OXYCODONE-ACETAMINOPHEN 5-325 MG PO TABS: 2.0000 | ORAL_TABLET | ORAL | Status: DC | PRN

## 2018-02-03 MED ORDER — SOD CITRATE-CITRIC ACID 500-334 MG/5ML PO SOLN
30.0000 mL | ORAL | Status: DC | PRN
  Administered 2018-02-04: 30 mL via ORAL
  Filled 2018-02-03: qty 15

## 2018-02-03 MED ORDER — TERBUTALINE SULFATE 1 MG/ML IJ SOLN: 0.2500 mg | Freq: Once | INTRAMUSCULAR | Status: DC | PRN

## 2018-02-03 MED ORDER — LACTATED RINGERS IV SOLN
INTRAVENOUS | Status: DC
  Administered 2018-02-03 – 2018-02-04 (×4): via INTRAVENOUS

## 2018-02-03 MED ORDER — ACETAMINOPHEN 325 MG PO TABS: 650.0000 mg | ORAL_TABLET | ORAL | Status: DC | PRN

## 2018-02-03 MED ORDER — FENTANYL CITRATE (PF) 100 MCG/2ML IJ SOLN: 100.0000 ug | INTRAMUSCULAR | Status: DC | PRN

## 2018-02-03 MED ORDER — OXYTOCIN 40 UNITS IN LACTATED RINGERS INFUSION - SIMPLE MED
1.0000 m[IU]/min | INTRAVENOUS | Status: DC
  Administered 2018-02-03: 2 m[IU]/min via INTRAVENOUS
  Filled 2018-02-03: qty 1000

## 2018-02-03 MED ORDER — LIDOCAINE HCL (PF) 1 % IJ SOLN: 30.0000 mL | INTRAMUSCULAR | Status: DC | PRN

## 2018-02-03 MED ORDER — OXYTOCIN 40 UNITS IN LACTATED RINGERS INFUSION - SIMPLE MED: 2.5000 [IU]/h | INTRAVENOUS | Status: DC

## 2018-02-03 MED ORDER — ONDANSETRON HCL 4 MG/2ML IJ SOLN: 4.0000 mg | Freq: Four times a day (QID) | INTRAMUSCULAR | Status: DC | PRN

## 2018-02-03 NOTE — Progress Notes (Signed)
   PRENATAL VISIT NOTE  Subjective:  Shelley Wiggins is a 22 y.o. G2P1001 at 214w0d being seen today for ongoing prenatal care.  She is currently monitored for the following issues for this high-risk pregnancy and has Supervision of low-risk pregnancy; Previous cesarean delivery affecting pregnancy, antepartum; Language barrier; and Late prenatal care affecting pregnancy, antepartum, third trimester on their problem list.  Patient reports LOF, contractions, & decreased fetal movement.  Reports regular contractions at night for the last 2 nights; last felts contractions at 5 this morning. Has also felt like she has been leaking amniotic fluid. Feels like underwear stay damp; for the last week. Concerned b/c she had a c/section with her last baby "due to baby losing too much fluid". Decreased fetal movement for the last week. Reports that she hasn't felt the baby move at all today.    Contractions: Irregular. Vag. Bleeding: None.  Movement: Present. Denies leaking of fluid.   The following portions of the patient's history were reviewed and updated as appropriate: allergies, current medications, past family history, past medical history, past social history, past surgical history and problem list. Problem list updated.  Objective:   Vitals:   02/03/18 1039  BP: (!) 133/58  Pulse: 89  Weight: 215 lb 12.8 oz (97.9 kg)    Fetal Status: Fetal Heart Rate (bpm): 150 Fundal Height: 39 cm Movement: Present  Presentation: Vertex (per ultrasound)  General:  Alert, oriented and cooperative. Patient is in no acute distress.  Skin: Skin is warm and dry. No rash noted.   Cardiovascular: Normal heart rate noted  Respiratory: Normal respiratory effort, no problems with respiration noted  Abdomen: Soft, gravid, appropriate for gestational age.  Pain/Pressure: Present     Pelvic: Cervical exam performed Dilation: 1 Effacement (%): Thick Station: -3  SSE performed -- no pooling of fluid  Extremities: Normal  range of motion.  Edema: None  Mental Status: Normal mood and affect. Normal behavior. Normal judgment and thought content.   Assessment and Plan:  Pregnancy: G2P1001 at 874w0d  1. Encounter for supervision of low-risk pregnancy in third trimester -C/w Dr. Macon LargeAnyanwu. Will admit for IOL d/t complaint of no fetal movement today at [redacted] wks gestation  2. Decreased fetal movements in third trimester, single or unspecified fetus   3. Previous cesarean delivery affecting pregnancy, antepartum      Judeth HornErin Osei Anger, NP

## 2018-02-03 NOTE — Progress Notes (Signed)
[place] Spanish interpreter at bedside for admission questions. CNM discussed plan of care with pt and answered all questions.

## 2018-02-03 NOTE — Progress Notes (Signed)
Pt states has been having contractions for the last 2 days & fluid leakage.

## 2018-02-03 NOTE — Progress Notes (Signed)
Dr Despina HiddenEure in to discuss plan of care; Nettie ElmSylvia, interpreter in to interpret

## 2018-02-03 NOTE — H&P (Addendum)
Shelley Wiggins is a 22 y.o. female G2P1001 @ 40.0wks by third tri scan presenting for IOL for decreased fetal movement. Her hx is significant for having a prev [place] in [place] in [place] for 'low fluid.' She initiated prenatal care with this preg at 38.6wks and has had two visits- this was because she was in the process of immigration and was detained.  OB History    Gravida  2   Para  1   Term  1   Preterm      AB      Living  1     SAB      TAB      Ectopic      Multiple      Live Births  1          Past Medical History:  Diagnosis Date  . Calcium deficiency    Past Surgical History:  Procedure Laterality Date  . CESAREAN SECTION     Family History: family history includes Diabetes in her father, paternal aunt, and paternal grandmother. Social History:  reports that she has never smoked. She has never used smokeless tobacco. She reports that she does not drink alcohol or use drugs.     Maternal Diabetes: No  Genetic Screening: Declined (too late to care) Maternal Ultrasounds/Referrals: Normal Fetal Ultrasounds or other Referrals:  None Maternal Substance Abuse:  No Significant Maternal Medications:  None Significant Maternal Lab Results:  Lab values include: Group B Strep negative Other Comments:  None  Review of Systems  Constitutional: Negative.   HENT: Negative.   Eyes: Negative.   Respiratory: Positive for shortness of breath (chronic since childhood but does not bother her enough for medications).   Cardiovascular: Negative.   Gastrointestinal: Negative.   Genitourinary: Negative.   Musculoskeletal: Negative.   Skin: Negative.   Neurological: Negative.   Endo/Heme/Allergies: Negative.   Psychiatric/Behavioral: Negative.    Maternal Medical History:  Contractions: Onset was less than 1 hour ago.   Frequency: irregular.   Perceived severity is mild.    Fetal activity: Perceived fetal activity is decreased.   Last perceived fetal movement was  within the past hour.    Prenatal complications: no prenatal complications Prenatal Complications - Diabetes: none.    Dilation: Fingertip Exam by:: K.Esmee Fallaw, CNM Blood pressure 133/66, pulse 88, resp. rate 18, height 5\' 6"  (1.676 m), weight 97.9 kg, last menstrual period 04/29/2017. Maternal Exam:  Uterine Assessment: Contraction strength is mild.  Contraction frequency is irregular.   Abdomen: Patient reports no abdominal tenderness. Introitus: Normal vulva. Normal vagina.  Pelvis: adequate for delivery.   Cervix: Cervix evaluated by digital exam.     Physical Exam  Nursing note and vitals reviewed. Constitutional: She is oriented to person, place, and time. She appears well-developed and well-nourished.  HENT:  Head: Normocephalic.  Eyes: Pupils are equal, round, and reactive to light.  Neck: Normal range of motion.  Cardiovascular: Normal rate, regular rhythm and normal heart sounds.  Respiratory: Effort normal and breath sounds normal. No respiratory distress.  GI: Soft. Bowel sounds are normal. There is no tenderness.  Genitourinary: Vagina normal.  Musculoskeletal: Normal range of motion. She exhibits no edema.  Neurological: She is alert and oriented to person, place, and time.  Skin: Skin is warm and dry.  Psychiatric: She has a normal mood and affect. Her behavior is normal. Judgment and thought content normal.    Prenatal labs: ABO, Rh: O/Positive/-- (11/14 1559) Antibody: Negative (11/14 1559) Rubella:  4.61 (11/14 1559) RPR: Non Reactive (11/14 1559)  HBsAg: Negative (11/14 1559)  HIV: Non Reactive (11/14 1559)  GBS:   negative 01/15/18  Assessment/Plan: G2P1001at [redacted]w[redacted]d IOL for decreased fetal movement (fetal movement seen/felt on exam) Pitocin, will re-attempt FB placement in 2-3hrs  [name] 02/03/2018, 1:26 PM   CNM attestation:  I have seen and examined this patient; I agree with above documentation in the student midwife's note.   [name] is a 22 y.o. G2P1001 here for IOL due to persistent decreased FM; Hx of prev C/S for oligo in [place].  PE: BP 119/62   Pulse 88   Resp 18   Ht 5\' 6"  (1.676 m)   Wt 97.9 kg   LMP 04/29/2017 (Exact Date)   BMI 34.83 kg/m  Gen: calm comfortable, NAD Resp: normal effort, no distress Abd: gravid  ROS, labs, PMH reviewed  Plan: Admit to [employer] Pt requesting TOLAC Cx unfavorable, and unable to place cervical foley due to cx being FT Will start low dose Pit and then try foley later Anticipate SVD Plan for SW consult pp as pt is new to the country  Arabella Merles CNM 02/03/2018, 3:04 PM

## 2018-02-03 NOTE — Progress Notes (Signed)
Royann Carlynn Purlerez is a 22 y.o. G2P1001 at 8025w0d admitted for induction of labor due to decreased fetal movement.  Subjective: Patient resting in bed, no complaints. [facility].   Objective: BP 133/66   Pulse 88   Resp 18   Ht 5\' 6"  (1.676 m)   Wt 97.9 kg   LMP 04/29/2017 (Exact Date)   BMI 34.83 kg/m  No intake/output data recorded.  FHT:  FHR: 140 bpm, variability: absent,  accelerations:  Present,  decelerations:  Absent UC:   none SVE:   Dilation: Fingertip Exam by:: K.Shaw, CNM   Attempted FB placement by myself and K. Clelia CroftShaw, CNM - cervix remains very posterior and not completely open.  Labs: Lab Results  Component Value Date   WBC 12.3 (H) 02/03/2018   HGB 13.7 02/03/2018   HCT 40.7 02/03/2018   MCV 88.7 02/03/2018   PLT 269 02/03/2018    Assessment / Plan: IOL for decreased fetal movement  Labor: Pitocin started, will attempt FB placement again in 2-3hrs Preeclampsia:  no signs or symptoms of toxicity Fetal Wellbeing:  Category I Pain Control:  Epidural I/D:  n/a Anticipated MOD:  NSVD  Bernerd LimboJamilla R Quinlin Conant, SNM 02/03/2018, 1:15 PM

## 2018-02-03 NOTE — Progress Notes (Signed)
Patient ID: Shelley Wiggins, female   DOB: 06/25/1995, 22 y.o.   MRN: [id number]  Has had steady dose of Pit 426mu/min running during the day  BP 120/70, P 92 FHR 140s, +accels, no decels Ctx irreg, mild Cx 1/thick/vtx -2, bloody show  IUP@term  Cx unfavorable TOLAC  Cervical foley inserted using speculum Will keep steady dose of Pit until foley comes out, then start to increase  Arabella MerlesKimberly D Sanyiah Kanzler CNM 02/03/2018 5:06 PM

## 2018-02-04 ENCOUNTER — Encounter (HOSPITAL_COMMUNITY): Payer: Self-pay

## 2018-02-04 ENCOUNTER — Other Ambulatory Visit: Payer: Self-pay

## 2018-02-04 ENCOUNTER — Encounter (HOSPITAL_COMMUNITY)
Admission: AD | Disposition: A | Discharge status: Home / Self Care | Payer: Self-pay | Source: Home / Self Care | Attending: Obstetrics and Gynecology

## 2018-02-04 ENCOUNTER — Inpatient Hospital Stay (HOSPITAL_COMMUNITY): Payer: Medicaid Other | Admitting: Certified Registered Nurse Anesthetist

## 2018-02-04 DIAGNOSIS — O368130 Decreased fetal movements, third trimester, not applicable or unspecified: Secondary | ICD-10-CM

## 2018-02-04 DIAGNOSIS — O34211 Maternal care for low transverse scar from previous cesarean delivery: Secondary | ICD-10-CM

## 2018-02-04 DIAGNOSIS — Z3A40 40 weeks gestation of pregnancy: Secondary | ICD-10-CM

## 2018-02-04 DIAGNOSIS — O480 Post-term pregnancy: Secondary | ICD-10-CM

## 2018-02-04 LAB — CBC
HEMATOCRIT: 38.6 % (ref 36.0–46.0)
HEMOGLOBIN: 13 g/dL (ref 12.0–15.0)
MCH: 29.4 pg (ref 26.0–34.0)
MCHC: 33.7 g/dL (ref 30.0–36.0)
MCV: 87.3 fL (ref 80.0–100.0)
Platelets: 242 10*3/uL (ref 150–400)
RBC: 4.42 MIL/uL (ref 3.87–5.11)
RDW: 13.6 % (ref 11.5–15.5)
WBC: 16.9 10*3/uL — ABNORMAL HIGH (ref 4.0–10.5)
nRBC: 0 % (ref 0.0–0.2)

## 2018-02-04 LAB — CREATININE, SERUM
Creatinine, Ser: 0.41 mg/dL — ABNORMAL LOW (ref 0.44–1.00)
GFR calc non Af Amer: >60 mL/min (ref >60)

## 2018-02-04 LAB — RPR: RPR Ser Ql: NONREACTIVE

## 2018-02-04 SURGERY — Surgical Case
Anesthesia: Spinal

## 2018-02-04 MED ORDER — OXYTOCIN 10 UNIT/ML IJ SOLN
INTRAVENOUS | Status: DC | PRN
  Administered 2018-02-04: 40 [IU] via INTRAVENOUS

## 2018-02-04 MED ORDER — MENTHOL 3 MG MT LOZG: 1.0000 | LOZENGE | OROMUCOSAL | Status: DC | PRN

## 2018-02-04 MED ORDER — HYDROMORPHONE HCL 1 MG/ML IJ SOLN
INTRAMUSCULAR | Status: AC
  Filled 2018-02-04: qty 0.5

## 2018-02-04 MED ORDER — NALBUPHINE HCL 10 MG/ML IJ SOLN: 5.0000 mg | INTRAMUSCULAR | Status: DC | PRN

## 2018-02-04 MED ORDER — NALBUPHINE HCL 10 MG/ML IJ SOLN: 5.0000 mg | Freq: Once | INTRAMUSCULAR | Status: DC | PRN

## 2018-02-04 MED ORDER — ONDANSETRON HCL 4 MG/2ML IJ SOLN
INTRAMUSCULAR | Status: DC | PRN
  Administered 2018-02-04: 4 mg via INTRAVENOUS

## 2018-02-04 MED ORDER — ZOLPIDEM TARTRATE 5 MG PO TABS: 5.0000 mg | ORAL_TABLET | Freq: Every evening | ORAL | Status: DC | PRN

## 2018-02-04 MED ORDER — FENTANYL CITRATE (PF) 100 MCG/2ML IJ SOLN
INTRAMUSCULAR | Status: DC | PRN
  Administered 2018-02-04: 15 ug via INTRATHECAL

## 2018-02-04 MED ORDER — MEPERIDINE HCL 25 MG/ML IJ SOLN: 6.2500 mg | INTRAMUSCULAR | Status: DC | PRN

## 2018-02-04 MED ORDER — OXYCODONE HCL 5 MG PO TABS: 10.0000 mg | ORAL_TABLET | ORAL | Status: DC | PRN

## 2018-02-04 MED ORDER — WITCH HAZEL-GLYCERIN EX PADS: 1.0000 "application " | MEDICATED_PAD | CUTANEOUS | Status: DC | PRN

## 2018-02-04 MED ORDER — SODIUM CHLORIDE 0.9 % IV SOLN
500.0000 mg | Freq: Once | INTRAVENOUS | Status: DC
  Filled 2018-02-04: qty 500

## 2018-02-04 MED ORDER — SCOPOLAMINE 1 MG/3DAYS TD PT72: 1.0000 | MEDICATED_PATCH | Freq: Once | TRANSDERMAL | Status: DC

## 2018-02-04 MED ORDER — KETOROLAC TROMETHAMINE 30 MG/ML IJ SOLN: 30.0000 mg | Freq: Four times a day (QID) | INTRAMUSCULAR | Status: AC | PRN

## 2018-02-04 MED ORDER — SIMETHICONE 80 MG PO CHEW: 80.0000 mg | CHEWABLE_TABLET | ORAL | Status: DC | PRN

## 2018-02-04 MED ORDER — OXYTOCIN 10 UNIT/ML IJ SOLN
INTRAMUSCULAR | Status: AC
  Filled 2018-02-04: qty 4

## 2018-02-04 MED ORDER — PHENYLEPHRINE 8 MG IN D5W 100 ML (0.08MG/ML) PREMIX OPTIME
INJECTION | INTRAVENOUS | Status: DC | PRN
  Administered 2018-02-04: 60 ug/min via INTRAVENOUS

## 2018-02-04 MED ORDER — MORPHINE SULFATE (PF) 0.5 MG/ML IJ SOLN
INTRAMUSCULAR | Status: DC | PRN
  Administered 2018-02-04: .15 mg via INTRATHECAL

## 2018-02-04 MED ORDER — KETOROLAC TROMETHAMINE 30 MG/ML IJ SOLN
INTRAMUSCULAR | Status: AC
  Administered 2018-02-04: 30 mg via INTRAVENOUS
  Filled 2018-02-04: qty 1

## 2018-02-04 MED ORDER — IBUPROFEN 600 MG PO TABS
600.0000 mg | ORAL_TABLET | Freq: Four times a day (QID) | ORAL | Status: DC
  Administered 2018-02-05 – 2018-02-06 (×6): 600 mg via ORAL
  Filled 2018-02-04 (×6): qty 1

## 2018-02-04 MED ORDER — NALOXONE HCL 0.4 MG/ML IJ SOLN: 0.4000 mg | INTRAMUSCULAR | Status: DC | PRN

## 2018-02-04 MED ORDER — SENNOSIDES-DOCUSATE SODIUM 8.6-50 MG PO TABS
2.0000 | ORAL_TABLET | ORAL | Status: DC
  Administered 2018-02-04 – 2018-02-05 (×2): 2 via ORAL
  Filled 2018-02-04 (×2): qty 2

## 2018-02-04 MED ORDER — OXYCODONE HCL 5 MG PO TABS
5.0000 mg | ORAL_TABLET | ORAL | Status: DC | PRN
  Administered 2018-02-04 – 2018-02-05 (×2): 5 mg via ORAL
  Filled 2018-02-04 (×2): qty 1

## 2018-02-04 MED ORDER — BUPIVACAINE IN DEXTROSE 0.75-8.25 % IT SOLN
INTRATHECAL | Status: DC | PRN
  Administered 2018-02-04: 1.6 mL via INTRATHECAL

## 2018-02-04 MED ORDER — MORPHINE SULFATE (PF) 0.5 MG/ML IJ SOLN
INTRAMUSCULAR | Status: AC
  Filled 2018-02-04: qty 10

## 2018-02-04 MED ORDER — ONDANSETRON HCL 4 MG/2ML IJ SOLN: 4.0000 mg | Freq: Three times a day (TID) | INTRAMUSCULAR | Status: DC | PRN

## 2018-02-04 MED ORDER — HYDROCODONE-ACETAMINOPHEN 7.5-325 MG PO TABS: 1.0000 | ORAL_TABLET | Freq: Once | ORAL | Status: DC | PRN

## 2018-02-04 MED ORDER — SIMETHICONE 80 MG PO CHEW
80.0000 mg | CHEWABLE_TABLET | ORAL | Status: DC
  Administered 2018-02-04 – 2018-02-06 (×2): 80 mg via ORAL
  Filled 2018-02-04 (×2): qty 1

## 2018-02-04 MED ORDER — KETOROLAC TROMETHAMINE 30 MG/ML IJ SOLN
30.0000 mg | Freq: Four times a day (QID) | INTRAMUSCULAR | Status: AC | PRN
  Administered 2018-02-04: 30 mg via INTRAVENOUS

## 2018-02-04 MED ORDER — LACTATED RINGERS IV SOLN
INTRAVENOUS | Status: DC
  Administered 2018-02-05: 999 mL via INTRAVENOUS

## 2018-02-04 MED ORDER — SODIUM CHLORIDE 0.9 % IR SOLN
Status: DC | PRN
  Administered 2018-02-04: 1

## 2018-02-04 MED ORDER — HYDROMORPHONE HCL 1 MG/ML IJ SOLN
0.2500 mg | INTRAMUSCULAR | Status: DC | PRN
  Administered 2018-02-04: 0.5 mg via INTRAVENOUS

## 2018-02-04 MED ORDER — ONDANSETRON HCL 4 MG/2ML IJ SOLN
INTRAMUSCULAR | Status: AC
  Filled 2018-02-04: qty 2

## 2018-02-04 MED ORDER — SCOPOLAMINE 1 MG/3DAYS TD PT72
MEDICATED_PATCH | TRANSDERMAL | Status: DC | PRN
  Administered 2018-02-04: 1 via TRANSDERMAL

## 2018-02-04 MED ORDER — PHENYLEPHRINE 8 MG IN D5W 100 ML (0.08MG/ML) PREMIX OPTIME
INJECTION | INTRAVENOUS | Status: AC
  Filled 2018-02-04: qty 100

## 2018-02-04 MED ORDER — OXYTOCIN 40 UNITS IN LACTATED RINGERS INFUSION - SIMPLE MED: 2.5000 [IU]/h | INTRAVENOUS | Status: AC

## 2018-02-04 MED ORDER — CEFAZOLIN SODIUM-DEXTROSE 2-4 GM/100ML-% IV SOLN
INTRAVENOUS | Status: AC
  Filled 2018-02-04: qty 100

## 2018-02-04 MED ORDER — SODIUM CHLORIDE 0.9% FLUSH: 3.0000 mL | INTRAVENOUS | Status: DC | PRN

## 2018-02-04 MED ORDER — DIPHENHYDRAMINE HCL 50 MG/ML IJ SOLN: 12.5000 mg | INTRAMUSCULAR | Status: DC | PRN

## 2018-02-04 MED ORDER — SCOPOLAMINE 1 MG/3DAYS TD PT72
MEDICATED_PATCH | TRANSDERMAL | Status: AC
  Filled 2018-02-04: qty 1

## 2018-02-04 MED ORDER — ACETAMINOPHEN 325 MG PO TABS
650.0000 mg | ORAL_TABLET | ORAL | Status: DC | PRN
  Administered 2018-02-04 – 2018-02-05 (×2): 650 mg via ORAL
  Filled 2018-02-04 (×2): qty 2

## 2018-02-04 MED ORDER — DIBUCAINE 1 % RE OINT: 1.0000 "application " | TOPICAL_OINTMENT | RECTAL | Status: DC | PRN

## 2018-02-04 MED ORDER — TETANUS-DIPHTH-ACELL PERTUSSIS 5-2.5-18.5 LF-MCG/0.5 IM SUSP: 0.5000 mL | Freq: Once | INTRAMUSCULAR | Status: DC

## 2018-02-04 MED ORDER — LACTATED RINGERS IV SOLN
INTRAVENOUS | Status: DC | PRN
  Administered 2018-02-04: 18:00:00 via INTRAVENOUS

## 2018-02-04 MED ORDER — FENTANYL CITRATE (PF) 100 MCG/2ML IJ SOLN
INTRAMUSCULAR | Status: AC
  Filled 2018-02-04: qty 2

## 2018-02-04 MED ORDER — NALOXONE HCL 4 MG/10ML IJ SOLN
1.0000 ug/kg/h | INTRAVENOUS | Status: DC | PRN
  Filled 2018-02-04: qty 5

## 2018-02-04 MED ORDER — DIPHENHYDRAMINE HCL 25 MG PO CAPS
25.0000 mg | ORAL_CAPSULE | ORAL | Status: DC | PRN
  Filled 2018-02-04: qty 1

## 2018-02-04 MED ORDER — ACETAMINOPHEN 10 MG/ML IV SOLN: 1000.0000 mg | Freq: Once | INTRAVENOUS | Status: DC | PRN

## 2018-02-04 MED ORDER — CEFAZOLIN SODIUM-DEXTROSE 2-3 GM-%(50ML) IV SOLR
INTRAVENOUS | Status: DC | PRN
  Administered 2018-02-04: 2 g via INTRAVENOUS

## 2018-02-04 MED ORDER — SIMETHICONE 80 MG PO CHEW
80.0000 mg | CHEWABLE_TABLET | Freq: Three times a day (TID) | ORAL | Status: DC
  Administered 2018-02-05 – 2018-02-06 (×5): 80 mg via ORAL
  Filled 2018-02-04 (×5): qty 1

## 2018-02-04 MED ORDER — ENOXAPARIN SODIUM 40 MG/0.4ML ~~LOC~~ SOLN
40.0000 mg | SUBCUTANEOUS | Status: DC
  Administered 2018-02-05 – 2018-02-06 (×2): 40 mg via SUBCUTANEOUS
  Filled 2018-02-04 (×2): qty 0.4

## 2018-02-04 MED ORDER — COCONUT OIL OIL: 1.0000 "application " | TOPICAL_OIL | Status: DC | PRN

## 2018-02-04 MED ORDER — PRENATAL MULTIVITAMIN CH
1.0000 | ORAL_TABLET | Freq: Every day | ORAL | Status: DC
  Administered 2018-02-05 – 2018-02-06 (×2): 1 via ORAL
  Filled 2018-02-04 (×2): qty 1

## 2018-02-04 MED ORDER — LACTATED RINGERS IV SOLN
INTRAVENOUS | Status: DC | PRN
  Administered 2018-02-04: 17:00:00 via INTRAVENOUS

## 2018-02-04 MED ORDER — PROMETHAZINE HCL 25 MG/ML IJ SOLN: 6.2500 mg | INTRAMUSCULAR | Status: DC | PRN

## 2018-02-04 MED ORDER — DIPHENHYDRAMINE HCL 25 MG PO CAPS: 25.0000 mg | ORAL_CAPSULE | Freq: Four times a day (QID) | ORAL | Status: DC | PRN

## 2018-02-04 SURGICAL SUPPLY — 36 items
BENZOIN TINCTURE PRP APPL 2/3 (GAUZE/BANDAGES/DRESSINGS) ×3 IMPLANT
CANISTER SUCT 3000ML PPV (MISCELLANEOUS) ×3 IMPLANT
CHLORAPREP W/TINT 26ML (MISCELLANEOUS) ×3 IMPLANT
CLOSURE STERI STRIP 1/2 X4 (GAUZE/BANDAGES/DRESSINGS) ×3 IMPLANT
DRSG OPSITE POSTOP 4X10 (GAUZE/BANDAGES/DRESSINGS) ×3 IMPLANT
ELECT REM PT RETURN 9FT ADLT (ELECTROSURGICAL) ×3
ELECTRODE REM PT RTRN 9FT ADLT (ELECTROSURGICAL) ×1 IMPLANT
EXTRACTOR VACUUM KIWI (MISCELLANEOUS) ×3 IMPLANT
GLOVE BIOGEL PI IND STRL 7.0 (GLOVE) ×2 IMPLANT
GLOVE BIOGEL PI IND STRL 7.5 (GLOVE) ×1 IMPLANT
GLOVE BIOGEL PI INDICATOR 7.0 (GLOVE) ×4
GLOVE BIOGEL PI INDICATOR 7.5 (GLOVE) ×2
GLOVE SKINSENSE NS SZ7.0 (GLOVE) ×2
GLOVE SKINSENSE STRL SZ7.0 (GLOVE) ×1 IMPLANT
GOWN STRL REUS W/ TWL LRG LVL3 (GOWN DISPOSABLE) ×2 IMPLANT
GOWN STRL REUS W/ TWL XL LVL3 (GOWN DISPOSABLE) ×1 IMPLANT
GOWN STRL REUS W/TWL LRG LVL3 (GOWN DISPOSABLE) ×4
GOWN STRL REUS W/TWL XL LVL3 (GOWN DISPOSABLE) ×2
NS IRRIG 1000ML POUR BTL (IV SOLUTION) ×3 IMPLANT
PACK C SECTION WH (CUSTOM PROCEDURE TRAY) ×3 IMPLANT
PAD ABD 7.5X8 STRL (GAUZE/BANDAGES/DRESSINGS) ×3 IMPLANT
PAD OB MATERNITY 4.3X12.25 (PERSONAL CARE ITEMS) ×3 IMPLANT
PAD PREP 24X48 CUFFED NSTRL (MISCELLANEOUS) ×3 IMPLANT
PENCIL SMOKE EVAC W/HOLSTER (ELECTROSURGICAL) ×3 IMPLANT
RTRCTR C-SECT PINK 25CM LRG (MISCELLANEOUS) ×3 IMPLANT
SPONGE LAP 18X18 RF (DISPOSABLE) ×9 IMPLANT
STRIP CLOSURE SKIN 1/2X4 (GAUZE/BANDAGES/DRESSINGS) ×2 IMPLANT
SUT CHROMIC 1 CTX 36 (SUTURE) ×3 IMPLANT
SUT MNCRL 0 VIOLET CTX 36 (SUTURE) ×2 IMPLANT
SUT MON AB 4-0 PS1 27 (SUTURE) ×3 IMPLANT
SUT MONOCRYL 0 CTX 36 (SUTURE) ×4
SUT PLAIN 2 0 XLH (SUTURE) ×3 IMPLANT
SUT VIC AB 0 CT1 36 (SUTURE) ×6 IMPLANT
SUT VIC AB 3-0 CT1 27 (SUTURE) ×2
SUT VIC AB 3-0 CT1 TAPERPNT 27 (SUTURE) ×1 IMPLANT
TOWEL OR 17X24 6PK STRL BLUE (TOWEL DISPOSABLE) ×6 IMPLANT

## 2018-02-04 NOTE — Progress Notes (Signed)
L&D Note Patient desires rpt c-section and to stop tolac. Prior child 7lbs+. I d/w her that with two c-sections that mos MDs will not recommend TOLAC and she will need to have further c-sections with each baby with r/b. Pt okay with this. Pt would like to have low transverse skin incision. Will do ancef and azithro. Can proceed when OR is ready  Shelley Wiggins, Jr MD Attending Center [facility] 02/04/2018 Time: [phone]

## 2018-02-04 NOTE — Progress Notes (Signed)
LABOR PROGRESS NOTE  Shelley Wiggins is a 22 y.o. G2P1001 at 7353w1d  admitted for IOL for decreased FM.   Subjective: Comfortable. Rates contractions 2/10.   Objective: BP 117/63   Pulse 85   Temp 98.3 F (36.8 C) (Axillary)   Resp 18   Ht 5\' 6"  (1.676 m)   Wt 97.9 kg   LMP 04/29/2017 (Exact Date)   BMI 34.83 kg/m  or  Vitals:   02/04/18 1230 02/04/18 1300 02/04/18 1339 02/04/18 1401  BP: 120/63 (!) 116/50 115/62 117/63  Pulse: 84 86 84 85  Resp: 16 18 18 18   Temp:   98.3 F (36.8 C)   TempSrc:   Axillary   Weight:      Height:        Dilation: 2 Effacement (%): 50 Cervical Position: Posterior Station: -3 Presentation: Vertex Exam by:: dr wallace FHT: baseline rate 135, moderate varibility, +acel, no decel Toco: q2-4 min   Labs: Lab Results  Component Value Date   WBC 12.3 (H) 02/03/2018   HGB 13.7 02/03/2018   HCT 40.7 02/03/2018   MCV 88.7 02/03/2018   PLT 269 02/03/2018    Patient Active Problem List   Diagnosis Date Noted  . Decreased fetal movement 02/03/2018  . Supervision of low-risk pregnancy 01/26/2018  . Previous cesarean delivery affecting pregnancy, antepartum 01/26/2018  . Language barrier 01/26/2018  . Late prenatal care affecting pregnancy, antepartum, third trimester 01/26/2018    Assessment / Plan: 22 y.o. G2P1001 at 2653w1d here for IOL for decreased fetal movement.   Labor: TOLAC. Induction with pitocin. Pitocin currently at 20 mu/min. Continue to titrate as appropriate. Contractions becoming closer together.  Fetal Wellbeing:  Cat I  Pain Control:  Undecided about Epidural. Wants to make decision when ctx are stronger.  Anticipated MOD:  NSVD  Marcy Sirenatherine Wallace, D.O. OB Fellow  02/04/2018, 2:22 PM

## 2018-02-04 NOTE — Progress Notes (Signed)
Labor Progress Note  S: Strip note, plan discussed with RN.   O: BP 104/65   Pulse 92   Temp 97.7 F (36.5 C) (Oral)   Resp 16   Ht 5\' 6"  (1.676 m)   Wt 97.9 kg   LMP 04/29/2017 (Exact Date)   BMI 34.83 kg/m   FHT: 145bpm, mod var, +accels, no decels TOCO: irregular  CVE: Dilation: 1 Effacement (%): Thick Cervical Position: Posterior Station: -3 Presentation: Vertex Exam by:: k.forsell,rnc  A&P: 22 y.o. G2P1001 8830w1d here for IOL for TOLAC with decreased fetal movement   Currently on 6 milli-units of pitocin, patient refusing foley bulb placement after unsuccessful x2.  Continue induction management Anticipate SVD  [place] Chrystian Ressler, DO [facility] Res[facility] PGY-2 02/04/2018 6:38 AM

## 2018-02-04 NOTE — Transfer of Care (Signed)
Immediate Anesthesia Transfer of Care Note  Patient: Shelley Wiggins  Procedure(s) Performed: CESAREAN SECTION (N/A )  Patient Location: PACU  Anesthesia Type:Spinal  Level of Consciousness: awake, oriented and patient cooperative  Airway & Oxygen Therapy: Patient Spontanous Breathing  Post-op Assessment: Report given to RN and Post -op Vital signs reviewed and stable  Post vital signs: Reviewed and stable  Last Vitals:  Vitals Value Taken Time  BP 114/82 02/04/2018  7:11 PM  Temp    Pulse 85 02/04/2018  7:13 PM  Resp 17 02/04/2018  7:13 PM  SpO2 100 % 02/04/2018  7:13 PM  Vitals shown include unvalidated device data.  Last Pain:  Vitals:   02/04/18 1730  TempSrc:   PainSc: 2          Complications: No apparent anesthesia complications

## 2018-02-04 NOTE — Anesthesia Pain Management Evaluation Note (Signed)
  CRNA Pain Management Visit Note  Patient: Shelley Wiggins, 22 y.o., female  "Hello I am a member of the anesthesia team at [facility]. We have an anesthesia team available at all times to provide care throughout the hospital, including epidural management and anesthesia for C-section. I don't know your plan for the delivery whether it a natural birth, water birth, IV sedation, nitrous supplementation, doula or epidural, but we want to meet your pain goals."   1.Was your pain managed to your expectations on prior hospitalizations?   Did not ask  2.What is your expectation for pain management during this hospitalization?     unsure  3.How can we help you reach that goal? Patient will let RN know if she qants something for pain.  Record the patient's initial score and the patient's pain goal.   Pain: 0  Pain Goal: 6 [facility] wants you to be able to say your pain was always managed very well.  Jameela Michna 02/04/2018

## 2018-02-04 NOTE — Progress Notes (Signed)
LABOR PROGRESS NOTE  Shelley Wiggins is a 22 y.o. G2P1001 at 6627w1d  admitted for IOL for decreased FM.   Subjective: Comfortable. Not feeling much with contractions.   Objective: BP 115/63   Pulse 81   Temp 98.6 F (37 C) (Oral)   Resp 16   Ht 5\' 6"  (1.676 m)   Wt 97.9 kg   LMP 04/29/2017 (Exact Date)   BMI 34.83 kg/m  or  Vitals:   02/04/18 0730 02/04/18 0834 02/04/18 0902 02/04/18 0931  BP: (!) 104/41 116/64 110/64 115/63  Pulse: 92 83 99 81  Resp: 16 18 16 16   Temp:  98.6 F (37 C)    TempSrc:  Oral    Weight:      Height:        Dilation: 1 Effacement (%): Thick Cervical Position: Posterior Station: -3 Presentation: Vertex Exam by:: k.forsell,rnc FHT: baseline rate 140, moderate varibility, +acel, no decel Toco: q3-7 min   Labs: Lab Results  Component Value Date   WBC 12.3 (H) 02/03/2018   HGB 13.7 02/03/2018   HCT 40.7 02/03/2018   MCV 88.7 02/03/2018   PLT 269 02/03/2018    Patient Active Problem List   Diagnosis Date Noted  . Decreased fetal movement 02/03/2018  . Supervision of low-risk pregnancy 01/26/2018  . Previous cesarean delivery affecting pregnancy, antepartum 01/26/2018  . Language barrier 01/26/2018  . Late prenatal care affecting pregnancy, antepartum, third trimester 01/26/2018    Assessment / Plan: 22 y.o. G2P1001 at 7127w1d here for IOL for decreased fetal movement.   Labor: Induction with pitocin. Patient refusing FB placement after unsuccessful x2 and extensive counseling. Pitocin currently at 14 mu/min. Continue to titrate as appropriate.  Fetal Wellbeing:  Cat I  Pain Control:  Undecided about Epidural. Wants to make decision when ctx are stronger.  Anticipated MOD:  NSVD  Marcy Sirenatherine Oneida Mckamey, D.O. OB Fellow  02/04/2018, 10:07 AM

## 2018-02-04 NOTE — Anesthesia Preprocedure Evaluation (Signed)
Anesthesia Evaluation  Patient identified by MRN, date of birth, ID band Patient awake    Reviewed: Allergy & Precautions, H&P , NPO status , Patient's Chart, lab work & pertinent test results  Airway Mallampati: II  TM Distance: >3 FB Neck ROM: Full    Dental no notable dental hx. (+) Teeth Intact, Dental Advisory Given   Pulmonary neg pulmonary ROS,    Pulmonary exam normal breath sounds clear to auscultation       Cardiovascular negative cardio ROS Normal cardiovascular exam Rhythm:Regular Rate:Normal     Neuro/Psych negative neurological ROS  negative psych ROS   GI/Hepatic negative GI ROS, Neg liver ROS,   Endo/Other  negative endocrine ROS  Renal/GU negative Renal ROS     Musculoskeletal   Abdominal   Peds negative pediatric ROS (+)  Hematology negative hematology ROS (+)   Anesthesia Other Findings Spanish Speaking only.  Hx w Nurse, learning disabilitytranslator . Prior classical CS  Reproductive/Obstetrics (+) Pregnancy                             Lab Results  Component Value Date   WBC 12.3 (H) 02/03/2018   HGB 13.7 02/03/2018   HCT 40.7 02/03/2018   MCV 88.7 02/03/2018   PLT 269 02/03/2018    Anesthesia Physical Anesthesia Plan  ASA: II  Anesthesia Plan: Spinal   Post-op Pain Management:    Induction:   PONV Risk Score and Plan: 2 and Treatment may vary due to age or medical condition and Ondansetron  Airway Management Planned: Natural Airway  Additional Equipment:   Intra-op Plan:   Post-operative Plan:   Informed Consent: I have reviewed the patients History and Physical, chart, labs and discussed the procedure including the risks, benefits and alternatives for the proposed anesthesia with the patient or authorized representative who has indicated his/her understanding and acceptance.   Dental advisory given  Plan Discussed with: CRNA  Anesthesia Plan Comments:          Anesthesia Quick Evaluation

## 2018-02-04 NOTE — Progress Notes (Signed)
Nettie ElmSylvia, spanish interpreter at bedside to interpret for RN to complete assessment

## 2018-02-04 NOTE — Progress Notes (Signed)
This note also relates to the following rows which could not be included: Dose (milli-units/min) Oxytocin - Cannot attach notes to extension rows Rate (mL/hr) Oxytocin - Cannot attach notes to extension rows Concentration Oxytocin - Cannot attach notes to extension rows  After discussing options for induction plan, pt decided that she does not want a foley bulb placed due to 2 previous attempts of placement being very painful and pt reports that she is very sore from the attempts. RN spoke with Dr Despina HiddenEure and told him of pt's decision; Dr Despina HiddenEure gave orders for pitocin to be d/c'd; restart pitocin at 0400 at 2/2. Orders received from K.Shaw,CNM that pt may be off the monitors until pitocin is restarted

## 2018-02-04 NOTE — Anesthesia Postprocedure Evaluation (Signed)
Anesthesia Post Note  Patient: Shelley Wiggins  Procedure(s) Performed: CESAREAN SECTION (N/A )     Patient location during evaluation: Mother Baby Anesthesia Type: Spinal Level of consciousness: oriented and awake and alert Pain management: pain level controlled Vital Signs Assessment: post-procedure vital signs reviewed and stable Respiratory status: spontaneous breathing and respiratory function stable Cardiovascular status: blood pressure returned to baseline and stable Postop Assessment: no headache, no backache, no apparent nausea or vomiting and able to ambulate Anesthetic complications: no    Last Vitals:  Vitals:   02/04/18 2130 02/04/18 2230  BP: 107/64 121/63  Pulse: 75 81  Resp: 20   Temp: 37.1 C 37.2 C  SpO2: 98% 97%    Last Pain:  Vitals:   02/04/18 2230  TempSrc: Oral  PainSc: 5    Pain Goal:                 Trevor IhaStephen A Stuti Sandin

## 2018-02-04 NOTE — Progress Notes (Signed)
CSW acknowledged consult and will meet with patient after L&D.  Please contact CSW if a need arises prior to delivery or if patient requests to see CSW.    Blaine HamperAngel Boyd-Gilyard, MSW, LCSW Clinical Social Work [phone]

## 2018-02-04 NOTE — Op Note (Addendum)
Operative Note   SURGERY DATE: 02/04/2018  PRE-OP DIAGNOSIS:  *Pregnancy at 40w1 *Prior Cesarean section, desires repeat  POST-OP DIAGNOSIS: same   PROCEDURE: repeat low transverse cesarean section via pfannenstiel skin incision with double layer uterine closure  SURGEON: Surgeon(s) and Role:    George Bing, MD - Primary    * Earlene Plater, Laurel S, DO - Fellow  ANESTHESIA: spinal  ESTIMATED BLOOD LOSS: quantitative blood loss  DRAINS: UOP via indwelling foley  TOTAL IV FLUIDS: crystalloid  VTE PROPHYLAXIS: SCDs to bilateral lower extremities  ANTIBIOTICS: Two grams of Cefazolin were given., within 1 hour of skin incision and azithromycin 500mg  IV given intra operatively  SPECIMENS: none  COMPLICATIONS: tight skin opening necessitating kiwi vacuum assistance for delivery.   INDICATIONS: patient being induced for persistent decreased fetal movement at term and patient was induced with pitocin and got to 2cm but then desired another c-section. Risks, benefits discussed with her and she desired repeat. Maternal fetal status was reassuring at the time of surgery.   FINDINGS: Thick linea alba but no scar tissue noted. Grossly normal uterus, tubes and ovaries. Clear amniotic fluid, cephalic female infant, weight 3200gm, APGARs 10/10, intact placenta.  PROCEDURE IN DETAIL: The patient was asked and preferred to have a low transverse skin and not another vertical midline skin incision. The patient was taken to the operating room where anesthesia was administered and normal fetal heart tones were confirmed. She was then prepped and draped in the normal fashion in the dorsal supine position with a leftward tilt.  After a time out was performed, a pfannensteil skin incision was made with the scalpel and carried through to the underlying layer of fascia. The fascia was then incised at the midline and this incision was extended laterally with the mayo scissors. Attention was  turned to the superior aspect of the fascial incision which was grasped with the kocher clamps x 2, tented up and the rectus muscles were dissected off bluntly then with scalpel for midline. In a similar fashion the inferior aspect of the fascial incision was grasped with the kocher clamps, tented up and the rectus muscles dissected off with the mayo scissors. The rectus muscles were then separated in the midline with hemostats and the peritoneum was entered bluntly. An alexis retractor was placed. The bladder blade was inserted and the vesicouterine peritoneum was identified, tented up and entered with the metzenbaum scissors. This incision was extended laterally and the bladder flap was created digitally. The bladder blade was reinserted.  A low transverse hysterotomy was made with the scalpel until the endometrial cavity was breached and the amniotic sac ruptured bluntly with clear amniotic fluid. This incision was extended bluntly. Initial difficulty in delivery of the head so skin and fascial incision was extended on the right A Kiwi vacuum was applied to the head at the flexion point and used per manufacturers recommendation, and the infant's head was easily delivered, suction released and then the rest of the body delivered easily.The cord was clamped x 2 and cut, and the infant was handed to the awaiting pediatricians without delayed cord clamping secondary to fetal status  The placenta was then gradually expressed from the uterus and then the uterus was exteriorized and cleared of all clots and debris. The hysterotomy was repaired with a running suture of 1-0 Monocryl. A second imbricating layer of 1-0 Monocryl suture was then placed. Several figure-of-eight sutures of chromic gut were added to achieve excellent hemostasis.  The uterus and adnexa were then returned to the abdomen, and the hysterotomy and all operative sites were reinspected and excellent hemostasis was noted after irrigation and  suction of the abdomen with warm saline.  The fascia was reapproximated with 0 Vicryl in a simple running fashion bilaterally. The subcutaneous layer was then reapproximated with interrupted sutures of 2-0 plain gut, and the skin was then closed with 4-0 monocryl, in a subcuticular fashion.  The patient  tolerated the procedure well. Sponge, lap, needle, and instrument counts were correct x 2. The patient was transferred to the recovery room awake, alert and breathing independently in stable condition.  Cristal DeerLaurel S. Earlene PlaterWallace, DO OB/GYN Fellow   Agree with above. I was present and scrubbed for the entire procedure.   Cornelia Copaharlie Briggs Edelen, Jr MD Attending Center for [facility] Mid[facility](Faculty Practice)

## 2018-02-04 NOTE — Lactation Note (Signed)
This note was copied from a baby's chart. Lactation Consultation Note  Patient Name: Shelley Wiggins ZOXWR'UToday's Date: 02/04/2018 Reason for consult: Initial assessment;Term P2, 4 hour old female infant. Spanish interpreter used Wallene Huh([facility]) Per mom, she BF her eldest child for 16 months. Per mom, infant had 2 voids since delivery. Per mom, BF is going well and she doesn't have any concerns at this time. [place] recently moved to [place] ago [place]. [facility], [place]. Per mom, she previously BF less than 2 hours and  LC unable to observe a latch at this time. Mom doesn't have a breast pump at home. LC gave (harmony) hand pump and explained how to use, assemble, re-assemble, clean and store breast milk. LC discussed I & O. Reviewed Baby & Me book's Breastfeeding Basics.  Mom made aware of O/P services, breastfeeding support groups, community resources, and our phone # for post-discharge questions.  Mom knows to ask for assistance if she has any questions, concerns or needs help with breastfeeding.  Maternal Data Formula Feeding for Exclusion: No Has patient been taught Hand Expression?: Yes  Feeding Feeding Type: Breast Fed  LATCH Score Latch: Repeated attempts needed to sustain latch, nipple held in mouth throughout feeding, stimulation needed to elicit sucking reflex.  Audible Swallowing: A few with stimulation  Type of Nipple: Flat  Comfort (Breast/Nipple): Soft / non-tender  Hold (Positioning): Assistance needed to correctly position infant at breast and maintain latch.  LATCH Score: 6  Interventions Interventions: Breast feeding basics reviewed;Hand express;Hand pump  Lactation Tools Discussed/Used Tools: Flanges Flange Size: 27 WIC Program: No(Mom [place] interested in applying [place] Pump Review: Setup, frequency, and cleaning;Milk Storage Initiated by::  Danelle Earthlyobin Johncarlos Holtsclaw, IBCLC Date initiated:: 02/04/18   Consult Status Consult Status: Follow-up Date: 02/05/18 Follow-up type: In-patient    Danelle EarthlyRobin Kaysin Brock 02/04/2018, 10:44 PM

## 2018-02-05 ENCOUNTER — Encounter (HOSPITAL_COMMUNITY): Payer: Self-pay | Admitting: Obstetrics and Gynecology

## 2018-02-05 LAB — CBC
HEMATOCRIT: 35.6 % — AB (ref 36.0–46.0)
Hemoglobin: 11.8 g/dL — ABNORMAL LOW (ref 12.0–15.0)
MCH: 29.2 pg (ref 26.0–34.0)
MCHC: 33.1 g/dL (ref 30.0–36.0)
MCV: 88.1 fL (ref 80.0–100.0)
NRBC: 0 % (ref 0.0–0.2)
PLATELETS: 220 10*3/uL (ref 150–400)
RBC: 4.04 MIL/uL (ref 3.87–5.11)
RDW: 13.6 % (ref 11.5–15.5)
WBC: 11.5 10*3/uL — AB (ref 4.0–10.5)

## 2018-02-05 NOTE — Anesthesia Postprocedure Evaluation (Signed)
Anesthesia Post Note  Patient: Shelley Wiggins  Procedure(s) Performed: CESAREAN SECTION (N/A )     Patient location during evaluation: Mother Baby Anesthesia Type: Spinal Level of consciousness: awake and alert Pain management: pain level controlled Vital Signs Assessment: post-procedure vital signs reviewed and stable Respiratory status: spontaneous breathing, nonlabored ventilation and respiratory function stable Cardiovascular status: stable Postop Assessment: no headache, no backache and epidural receding Anesthetic complications: no    Last Vitals:  Vitals:   02/04/18 2340 02/05/18 0345  BP: (!) 101/53   Pulse: 74   Resp: 18 18  Temp: 36.8 C 37.1 C  SpO2: 98% (!) 66%    Last Pain:  Vitals:   02/05/18 0536  TempSrc:   PainSc: 0-No pain   Pain Goal:                 Junious SilkGILBERT,Katerra Ingman

## 2018-02-05 NOTE — Clinical Social Work Maternal (Signed)
CLINICAL SOCIAL WORK MATERNAL/CHILD NOTE  Patient Details  Name: Shelley Wiggins MRN: [id number] Date of Birth: [birth year]  Date:  02/05/2018  Clinical Social Worker Initiating Note:  Laurey Arrow Date/Time: Initiated:  02/05/18/1247     Child's Name:  Shelley Wiggins   Biological Parents:  Mother(FOB is Shon Hough; DOB 03/16/1992 and he is in [place]   Need for Interpreter:  None   Reason for Referral:  Late or No Prenatal Care    Address:  [place] [address]    Phone number:  [phone] (home)     Additional phone number:  Household Members/Support Persons (HM/SP):   (Per MOB, MOB resides for FOB's mother and brother. )   HM/SP Name Relationship DOB or Age  HM/SP -1        HM/SP -2        HM/SP -3        HM/SP -4        HM/SP -5        HM/SP -6        HM/SP -7        HM/SP -8          Natural Supports (not living in the home):  Immediate Family, Parent(FOB's family will be MOB's primary support. )   Professional Supports: None   Employment: Unemployed   Type of Work:     Education:      Homebound arranged:    Pensions consultant:  (MOB was provided information to apply for [employer] for infant, Shelley Wiggins, and Physicist, medical.)   Other Resources:      Cultural/Religious Considerations Which May Impact Care:  None reported  Strengths:  Ability to meet basic needs , Home prepared for child , Pediatrician chosen   Psychotropic Medications:         Pediatrician:    Solicitor area  Pediatrician List:   [facility] for [place]      Pediatrician Fax Number:    Risk Factors/Current Problems:  Chiropractor State:  Able to Concentrate , Alert , Insightful , Linear Thinking    Mood/Affect:  Comfortable , Happy , Bright , Relaxed    CSW Assessment: CSW met with MOB in room 102  with Spanish Interpreter to complete an assessment for late [facility] and recent incarceration.  When CSW arrived, MOB was resting in bed, MOB's Mother in law was holding infant, and MOB's brother in law was watching TV.  MOB gave CSW permission to complete the assessments while MOB's guest were present. CSW explained CSW's role and encouraged MOB to ask questions. MOB was polite and easy to engage.  MOB's guest appeared supportive of MOB and engaged during the assessment.   CSW inquired about MOB's PNC prior to MOB moving to [place].  MOB reported that MOB initiated [facility] while living in [place], and reported that care was limited due to the amount of patients at the community clinic.  CSW explained the hospital's policy regarding not having documentation regarding MOB's PNC; MOB understood. MOB denied the use of all illicit substance and expressed the MOB was not concerned about infants screens. CSW made MOB aware that infant's UDS was negative and CSW will continue to monitor infant's CDS.  CSW informed MOB that if infant's CDS is positive without an explanation, CSW will make  a report to [facility] CPS.   MOB communicated MOB is prepared for the infant and has all needed items. CSW assessed for psychosocial stressors and MOB identified a transportation barrier.  CSW informed MOB about Medicaid Transportation and MOB agreed to apply once infant is approved for Medicaid.  In the meantime, MOB's mother in law agreed to assist with transporting MOB and infant to doctors appointments. MOB denied all other needs but was receptive to other community resources.  MOB reported that MOB was held in jail by immigration for 24 hours in [place] prior to being transferred to [place] to be with FOB's family.  MOB communicated that she does not need any legal resources.    CSW educated MOB about PPD and informed MOB of possible supports and interventions to decrease PPD.  CSW also encouraged MOB to seek medical  attention if needed for increased signs and symptoms for PPD. MOB denied PPD with MOB's oldest child. CSW reviewed safe sleep and SIDS. MOB was knowledgeable.  MOB did not have any questions or concerns at this time, and CSW thanked MOB for allowing CSW to meet with MOB.  CSW Plan/Description:  No Further Intervention Required/No Barriers to Discharge, Sudden Infant Death Syndrome (SIDS) Education, Perinatal Mood and Anxiety Disorder (PMADs) Education, Other Patient/Family Education, [place], CSW Will Continue to Monitor Umbilical Cord Tissue Drug Screen Results and Make Report if Warranted   Laurey Arrow, MSW, LCSW Clinical Social Work [phone]   Dimple Nanas, LCSW 02/05/2018, 12:52 PM

## 2018-02-05 NOTE — Addendum Note (Signed)
Addendum  created 02/05/18 0740 by Junious SilkGilbert, Tamaj Jurgens, CRNA   Sign clinical note

## 2018-02-05 NOTE — Progress Notes (Signed)
Subjective: Postpartum Day 1: Cesarean Delivery Patient reports tolerating PO, + flatus and no problems voiding.    Objective: Vital signs in last 24 hours: Temp:  [98 F (36.7 C)-98.9 F (37.2 C)] 98.7 F (37.1 C) (11/24 0345) Pulse Rate:  [74-99] 74 (11/23 2340) Resp:  [16-26] 18 (11/24 0345) BP: (101-126)/(50-94) 101/53 (11/23 2340) SpO2:  [66 %-100 %] 66 % (11/24 0345)  Physical Exam:  General: alert, cooperative and no distress Lochia: appropriate Uterine Fundus: firm Incision: 1/3 of honeycomb dressing saturated, area marked with scant bleeding past marked area.   DVT Evaluation: No evidence of DVT seen on physical exam. Negative Homan's sign. No cords or calf tenderness. No significant calf/ankle edema.  Recent Labs    02/03/18 1233 02/04/18 2119  HGB 13.7 13.0  HCT 40.7 38.6    Assessment/Plan: Status post Cesarean section. Doing well postoperatively.  Continue current care. Honeycomb dressing change today, order placed  Sharen CounterLisa Leftwich-Kirby 02/05/2018, 7:30 AM

## 2018-02-05 NOTE — Lactation Note (Signed)
This note was copied from a baby's chart. Lactation Consultation Note  Patient Name: Shelley Wiggins RUEAV'WToday's Date: 02/05/2018 Reason for consult: Follow-up assessment;Term;Infant weight loss  21 hours old FT female who is being now partially BF and formula fed by his mother, she's a P2. Mom is a P2 and experienced BF. She's been feeding baby large amounts of formula, baby had 30 ml of Similac on his last feeding and an episode of emesis during LC consultation, probably due to excessive volumes. LC brought fresh blankets and pillowcase per mom's request, baby had spitted up all over them.  Explained to parents about the size of newborn stomach and reviewed formula guidelines and storage, parents were reusing the same bottle for multiple feedings. Mom voiced understanding and is now aware that she needs to discard formula bottle if it's been opened in her room for more than one hour.  Mom had different questions, she had been set up with a DEBP and pumped twice today. Reminded mom the importance of consistent pumping and that pumping at this stage is mainly for breast stimulation and not to get volume. Baby was waking up, offered assistance with latch to try the NS # 24 and mom agreed to undress him and have him STS.  LC took baby to mom's left breast and he latched with a NS # 24 in cross cradle position. Baby able to latch for a short period of time, but falling asleep at the breast and will not continue sucking; he self released from the breast about a minute later. Noticed that mom has flat nipples, LC set her up with breast shells for home use, she didn't bring a nursing bra to the hospital.  Feeding plan:  1. Encouraged mom to feed baby STS 8-12 times/24 hours or sooner if feeding cues are present 2. Mom will use NS # 24 PRN 3. She'll try to pump every 3 hours during the day and at least once at night. 4. Whenever she decides to supplement with formula, she'll follow the recommended  guidelines according to baby's age. Will feed her EBM first.  Mom reported all questions and concerns were answered, she's aware of LC services and will call PRN.  Maternal Data    Feeding Feeding Type: Breast Fed  LATCH Score Latch: Repeated attempts needed to sustain latch, nipple held in mouth throughout feeding, stimulation needed to elicit sucking reflex.(with NS# 24)  Audible Swallowing: None  Type of Nipple: Flat  Comfort (Breast/Nipple): Soft / non-tender  Hold (Positioning): Assistance needed to correctly position infant at breast and maintain latch.  LATCH Score: 5  Interventions Interventions: Breast feeding basics reviewed;Assisted with latch;Skin to skin;Breast massage;Hand express;Breast compression;Adjust position;DEBP;Support pillows  Lactation Tools Discussed/Used Tools: Nipple Shields Nipple shield size: 24 Pump Review: Setup, frequency, and cleaning;Milk Storage Initiated by:: RN Date initiated:: 02/05/18   Consult Status Consult Status: Follow-up Date: 02/06/18 Follow-up type: In-patient    Shelley Wiggins Shelley Wiggins 02/05/2018, 3:39 PM

## 2018-02-06 MED ORDER — OXYCODONE HCL 5 MG PO TABS: 5.0000 mg | ORAL_TABLET | Freq: Four times a day (QID) | ORAL | 0 refills | Status: AC | PRN

## 2018-02-06 MED ORDER — IBUPROFEN 600 MG PO TABS: 600.0000 mg | ORAL_TABLET | Freq: Four times a day (QID) | ORAL | 0 refills | Status: AC

## 2018-02-06 NOTE — Progress Notes (Signed)
[employer] interpreter [phone] used for review of maternal and infant care and safety, maternal incision care and reasons to call the doctor. Pt states understanding information.

## 2018-02-06 NOTE — Progress Notes (Signed)
Telephonic interpreter Elita QuickJose [phone]) used to update feedings, answer questions mom may have, and to explain plan of care.  Mom asked about clothing and supplies for baby, which this nurse will refer to day nurse.

## 2018-02-06 NOTE — Discharge Summary (Addendum)
Postpartum Discharge Summary   Patient Name: Shelley Wiggins DOB: [birth year] MRN: [id number]  Date of admission: 02/03/2018 Delivering Provider: Tabernash Bing   Date of discharge: 02/06/2018  Admitting diagnosis: 40WKS,DECREASE FETAL MOVEMENT Intrauterine pregnancy: [redacted]w[redacted]d     Secondary diagnosis:  Active Problems:   Decreased fetal movement  Additional problems: na Discharge diagnosis: Term Pregnancy Delivered                                                   Post partum procedures:none Complications: None  Hospital course:  Onset of Labor With Unplanned C/S  22 y.o. yo [id number] at [redacted]w[redacted]d was admitted in Latent Labor on 02/03/2018. Patient had a labor course significant for TOLAC with elective rLTCS. Membrane Rupture Time/Date: 6:13 PM ,02/04/2018   The patient went for cesarean section due to Prior Uterine Surgery, and delivered a Viable infant,02/04/2018  Details of operation can be found in separate operative note. Patient had an uncomplicated postpartum course.  She is ambulating,tolerating a regular diet, passing flatus, and urinating well.  Patient is discharged home in stable condition 02/06/18.  Magnesium Sulfate recieved: No BMZ received: No  Physical exam  Vitals:   02/05/18 1215 02/05/18 1615 02/05/18 2238 02/06/18 0624  BP: 106/60 (!) 95/59 113/62 119/68  Pulse: 67 76 80 75  Resp: 16 16  16   Temp: 98.3 F (36.8 C) 97.7 F (36.5 C) 97.8 F (36.6 C) 98.1 F (36.7 C)  TempSrc: Oral Oral Oral   SpO2: 99% 98% 100% 97%  Weight:      Height:       General: alert, cooperative and no distress Lochia: appropriate Uterine Fundus: firm Incision: Healing well with no significant drainage, No significant erythema, Dressing is clean, dry, and intact DVT Evaluation: No evidence of DVT seen on physical exam. Labs: Lab Results  Component Value Date   WBC 11.5 (H) 02/05/2018   HGB 11.8 (L) 02/05/2018   HCT 35.6 (L) 02/05/2018   MCV 88.1 02/05/2018   PLT  220 02/05/2018   CMP Latest Ref Rng & Units 02/04/2018  Creatinine 0.44 - 1.00 mg/dL 6.96(E)    Discharge instruction: per After Visit Summary and "Baby and Me Booklet".  After visit meds:  Allergies as of 02/06/2018   No Known Allergies     Medication List    TAKE these medications   ibuprofen 600 MG tablet Commonly known as:  ADVIL,MOTRIN Take 1 tablet (600 mg total) by mouth every 6 (six) hours.   oxyCODONE 5 MG immediate release tablet Commonly known as:  Oxy IR/ROXICODONE Take 1 tablet (5 mg total) by mouth every 6 (six) hours as needed for up to 5 days for moderate pain (pain scale 4-7).   prenatal vitamin w/FE, FA 27-1 MG Tabs tablet Take 1 tablet by mouth daily at 12 noon.       Diet: routine diet  Activity: Advance as tolerated. Pelvic rest for 6 weeks.   Outpatient follow up:2 weeks Follow up Appt: Future Appointments  Date Time Provider Department Center  02/20/2018 10:15 AM [facility] NURSE [facility]  03/20/2018  8:55 AM  Bing, MD [facility]   Follow up Visit: Please schedule this patient for Postpartum visit in: 4 weeks with the following provider: MD For C/S patients schedule nurse incision check in weeks 2 weeks: yes Low risk pregnancy complicated by:  na Delivery mode:  CS Anticipated Birth Control:  other/unsure PP Procedures needed: na  Schedule Integrated BH visit: no  Newborn Data: Live born female  Birth Weight: 7 lb 0.9 oz (3200 g) APGAR: 10, 10  Newborn Delivery   Birth date/time:  02/04/2018 18:15:00 Delivery type:  C-Section, Vacuum Assisted Trial of labor:  Yes C-section categorization:  Repeat    Baby Feeding: Breast Disposition:home with mother  02/06/2018 Leeroy Bockhelsey L Anderson, DO  Attestation: I have seen this patient and agree with the resident's documentation. I have examined them separately, and we have discussed the plan of care.  Cristal DeerLaurel S. Earlene PlaterWallace, DO OB/GYN Fellow

## 2018-02-06 NOTE — Discharge Instructions (Signed)
Parto por cesariana, cuidados aps o procedimento Cesarean Delivery, Care After Consulte este folheto nas prximas semanas. Essas instrues fornecem informaes gerais para os cuidados aps o seu procedimento. Seu mdico tambm poder fornecer instrues mais especficas. Seu tratamento foi planejado de acordo com as prticas mdicas atuais, mas s vezes problemas podem ocorrer. Ligue para seu mdico se tiver algum problema ou dvida aps o procedimento. O que posso esperar aps o procedimento? Aps seu procedimento,  comum que ocorram:  Uma pequena quantidade de sangue ou lquido lmpido saindo da inciso.  Alguma vermelhido, inchao ou dor na rea da inciso.  Alguma irritao e dor abdominais.  [place] vaginal (lquios).  Clicas plvicas.  Fadiga.  Siga essas instrues em casa: BaristaComo cuidar da inciso   Siga as instrues do seu mdico sobre como Herbalistcuidar da inciso. Certifique-se de: ? Lavar as mos com gua e sabo antes de trocar sua atadura (curativo). Caso gua e sabo no estejam disponveis, use gel antissptico para as mos. ? Troque o curativo como indicado pelo seu mdico. ? No remova pontos (suturas), grampos, cola cirrgica ou faixas adesivas de uso dermatolgico. Pode ser necessrio deixar esses fechamentos de ferida no lugar por 2 semanas ou [place]. Caso as [place] das faixas adesivas comecem a se soltar ou dobrar, voc pode cortar as extremidades soltas. No remova faixas adesivas completamente a menos que seu mdico lhe diga para fazer isso.  Verifique a Programmer, systemsrea da inciso todos os dias em busca de sinais de infeco. Verifique a ocorrncia de: ? Aumento da vermelhido, do Film/video editorinchao ou da dor. ? Aumento da quantidade de lquido ou sangue. ? Calor. ? Pus ou mau cheiro.  Quando tossir ou Psychologist, educationalespirrar, abrace Rohm and Haasum travesseiro. Isso ajuda com a dor e diminui a chance de sua inciso se abrir (deiscncia). Faa isso at Marathon Oilque sua inciso cicatrize. Medicamentos  Tome  medicamentos vendidos com ou sem receita mdica somente de acordo com as indicaes do seu mdico.  Caso tenha recebido uma prescrio de antibitico, tome-o somente como determinado pelo seu mdico. No pare de tomar o antibitico at [facility] [place] ter terminado. Direo  No dirija nem opere mquinas pesadas enquanto estiver tomando analgsicos vendidos com receita mdica.  No dirija por 24 horas se tiver recebido Continental Airlinesum sedativo. Estilo de vida  No beba lcool. Isso  especialmente importante caso voc esteja amamentando ou tomando analgsicos.  No use derivados do tabaco, incluindo cigarros, tabaco de mascar ou cigarros eletrnicos. Caso precise de ajuda para parar de fumar, fale com seu mdico. O tabaco pode retardar a cicatrizao da inciso. Alimentos e bebidas  Beba pelo menos oito copos de 8 onas de gua todos os dias a menos que seu mdico diga o contrrio. Caso esteja amamentando, voc poder precisar beber CIT Groupainda mais gua.  Consuma alimentos com elevado teor de fibras todos os dias. Esses alimentos podero ajudar a Agricultural consultantprevenir ou aliviar a constipao. Alimentos ricos em fibras incluem: ? Cereais e pes integrais. ? Arroz integral. ? Feijes. ? Frutas e legumes frescos. Atividades  Retorne s suas atividades normais de acordo com as orientaes do seu mdico. Pergunte ao seu mdico quais atividades so seguras para voc.  Repouse o mximo possvel. Tente descansar ou tirar uma soneca quando seu beb estiver dormindo.  No levante nada mais pesado que seu beb ou mais de 10 libras (4,5 kg) conforme orientado pelo seu mdico.  Pergunte ao seu mdico sobre quando voc poder ter relaes sexuais. Isso pode depender de: ? Risco de infeces. ? Taxa de cicatrizao. ?  Conforto e desejo de realizar atividade sexual. °Banho °· Não tome banhos de banheira, nade, nem use hidromassagem até obter aprovação do seu médico. Pergunte ao seu médico se você pode tomar banho de chuveiro. Pode ser que  você possa tomar banho apenas com esponja até que sua incisão cicatrize. °· Mantenha a ferida seca conforme as orientações do seu médico. °Instruções gerais °· Não use absorventes internos nem faça duchas vaginais até que seu médico libere. °· Roupas: °? Use roupas confortáveis e largas. °? Use um sutiã de tamanho adequado e com suporte. °· Fique atenta para coágulos que possam ser expelidos pela sua vagina. Esses coágulos podem se parecer com acúmulos de secreção de cor vermelha escura, marrom ou preta. °· Mantenha seu períneo limpo e seco de acordo com as orientações do seu médico. °· Limpe-se com movimentos da frente para trás após usar o vaso sanitário. °· Se possível, peça a alguém para ajudar você com o bebê e nas tarefas domésticas por pelo menos alguns dias depois que você deixar o hospital. °· Compareça a todas as consultas de acompanhamento suas e do bebê de acordo com as orientações do seu médico. Isso é importante. °Entre em contato com um médico se: °· Você apresentar: °? Secreção vaginal com cheiro ruim. °? Dificuldade para urinar. °? Dor ao urinar. °? Aumento súbito na frequência das suas defecações. °? Piora na vermelhidão, inchaço ou dor ao redor da incisão. °? Piora na secreção ou sangue da incisão. °? Pus ou mau cheiro vindo do local da incisão. °? Febre. °? Uma erupção cutânea. °? Pouco ou nenhum interesse nas atividades de que você gostava. °? Dúvidas sobre como cuidar de você mesma ou do bebê. °? Enjoo. °· A incisão parecer quente ao toque. °· Suas mamas ficarem avermelhadas, doloridas ou duras. °· Você se sentir incomumente triste ou preocupada. °· Vomitar. °· Você expelir grandes coágulos de sangue pela vagina. Se expelir um grande coágulo, guarde-o para mostrar ao médico. Não elimine os coágulos no vaso sanitário sem mostrar para seu médico. °· Urinar mais do que o normal. °· Sentir tontura ou vertigem. °· Não tiver amamentado nenhuma vez e não tiver menstruado por 12 semanas após o  parto. °· Tiver parado de amamentar e não tiver menstruado por 12 semanas após parar de amamentar. °Obtenha ajuda imediatamente se: °· Você apresentar: °? Dor que não desaparece ou não melhora com medicamentos. °? Dor no peito. °? Dificuldade para respirar. °? Visão embaçada ou manchas na visão. °? Pensar em fazer mal a si mesma ou ao seu bebê. °? Surgirem dores no abdômen ou em uma das pernas. °? Dor de cabeça intensa. °· Desmaiar. °· Sangrar muito pela vagina a ponto de usar dois absorventes em uma hora. °Estas informações não se destinam a substituir as recomendações de seu médico. Não deixe de discutir quaisquer dúvidas com seu médico. °Document Released: 06/23/2015 Document Revised: 06/23/2016 Document Reviewed: 02/03/2015 °Elsevier Interactive Patient Education © 2018 Elsevier Inc. ° °

## 2018-02-06 NOTE — Lactation Note (Signed)
This note was copied from a baby's chart. Lactation Consultation Note  Patient Name: Shelley Wiggins [id number]'s Date: 02/06/2018  Follow up lactation visit.  Used [employer] interpreter [phone] Shelley Wiggins.  Mom reports she has only been in [place] 10 [place].  [place] and will have to find a job soon.  Mom reports she had a Csection so she is unaware how long it will be before she can look for a job.  Discussed exclusive breastfeeding for first 3-4 weeks until milk supply established. Discussed impact of bottles and formula on breastfeeding.  Mom reports no breast or nipple soreness.  Urged to follow up with lactation as needed   Maternal Data    Feeding Feeding Type: Breast Fed  LATCH Score Latch: Grasps breast easily, tongue down, lips flanged, rhythmical sucking.  Audible Swallowing: A few with stimulation  Type of Nipple: Everted at rest and after stimulation  Comfort (Breast/Nipple): Soft / non-tender  Hold (Positioning): Assistance needed to correctly position infant at breast and maintain latch.  LATCH Score: 8  Interventions    Lactation Tools Discussed/Used     Consult Status      Shakim Faith Michaelle CopasS Dionna Wiedemann 02/06/2018, 2:48 PM

## 2018-02-20 ENCOUNTER — Ambulatory Visit: Payer: Self-pay

## 2018-03-20 ENCOUNTER — Encounter: Payer: Self-pay | Admitting: Obstetrics and Gynecology

## 2018-03-20 ENCOUNTER — Ambulatory Visit: Payer: Self-pay | Admitting: Obstetrics and Gynecology

## 2018-03-21 NOTE — Progress Notes (Signed)
Patient did not keep her OB appointment for 03/20/2018.  Jordann Grime, Jr MD Attending [facility] (Faculty Practice)   

## 2018-09-20 ENCOUNTER — Other Ambulatory Visit: Payer: Self-pay | Admitting: Critical Care Medicine

## 2018-09-20 DIAGNOSIS — Z20822 Contact with and (suspected) exposure to covid-19: Secondary | ICD-10-CM

## 2018-09-20 NOTE — Progress Notes (Signed)
lab

## 2018-09-25 LAB — NOVEL CORONAVIRUS, NAA: SARS-CoV-2, NAA: NOT DETECTED

## 2019-10-16 ENCOUNTER — Encounter (HOSPITAL_COMMUNITY): Payer: Self-pay | Admitting: Emergency Medicine

## 2019-10-16 ENCOUNTER — Emergency Department (HOSPITAL_COMMUNITY)
Admission: EM | Admit: 2019-10-16 | Discharge: 2019-10-16 | Disposition: A | Discharge status: Home / Self Care | Payer: Self-pay | Attending: Emergency Medicine | Admitting: Emergency Medicine

## 2019-10-16 ENCOUNTER — Other Ambulatory Visit: Payer: Self-pay

## 2019-10-16 DIAGNOSIS — Z20822 Contact with and (suspected) exposure to covid-19: Secondary | ICD-10-CM | POA: Insufficient documentation

## 2019-10-16 DIAGNOSIS — J069 Acute upper respiratory infection, unspecified: Secondary | ICD-10-CM | POA: Insufficient documentation

## 2019-10-16 LAB — GROUP A STREP BY PCR: Group A Strep by PCR: NOT DETECTED

## 2019-10-16 LAB — SARS CORONAVIRUS 2 BY RT PCR (HOSPITAL ORDER, PERFORMED IN ~~LOC~~ HOSPITAL LAB): SARS Coronavirus 2: NEGATIVE

## 2019-10-16 NOTE — ED Provider Notes (Signed)
[facility] EMERGENCY DEPARTMENT Provider Note   CSN: [id number] Arrival date & time: 10/16/19  1044     History Chief Complaint  Patient presents with  . Cough    Shelley Wiggins is a 24 y.o. female who presents emergency department for URI symptoms.  History is gathered by the patient.  There is a language barrier and professional translation services are utilized.  She complains of 2 days of cough, sore throat, malaise.  She states that her throat hurt so bad she could barely sleep last night.  She took Tylenol without significant improvement in her symptoms.  She denies any known Covid exposure.  HPI     Past Medical History:  Diagnosis Date  . Calcium deficiency   . H/O cesarean section 2017   in [place] for oligo/fetal distress    Patient Active Problem List   Diagnosis Date Noted  . Decreased fetal movement 02/03/2018  . Supervision of low-risk pregnancy 01/26/2018  . Previous cesarean delivery affecting pregnancy, antepartum 01/26/2018  . Language barrier 01/26/2018  . Late prenatal care affecting pregnancy, antepartum, third trimester 01/26/2018    Past Surgical History:  Procedure Laterality Date  . CESAREAN SECTION    . CESAREAN SECTION N/A 02/04/2018   Procedure: CESAREAN SECTION;  Surgeon: Biola Bing, MD;  Location: [facility] BIRTHING SUITES;  Service: Obstetrics;  Laterality: N/A;     OB History    Gravida  2   Para  2   Term  2   Preterm      AB      Living  2     SAB      TAB      Ectopic      Multiple  0   Live Births  2           Family History  Problem Relation Age of Onset  . Diabetes Paternal Grandmother   . Diabetes Father   . Diabetes Paternal Aunt     Social History   Tobacco Use  . Smoking status: Never Smoker  . Smokeless tobacco: Never Used  Vaping Use  . Vaping Use: Never used  Substance Use Topics  . Alcohol use: Never  . Drug use: Never    Home Medications Prior  to Admission medications   Medication Sig Start Date End Date Taking? Authorizing Provider  prenatal vitamin w/FE, FA (PRENATAL 1 + 1) 27-1 MG TABS tablet Take 1 tablet by mouth daily at 12 noon.    [provider]    Allergies    Patient has no known allergies.  Review of Systems   Review of Systems Ten systems reviewed and are negative for acute change, except as noted in the HPI.   Physical Exam Updated Vital Signs BP 125/69 (BP Location: Right Arm)   Pulse 91   Temp 98.9 F (37.2 C) (Oral)   Resp 16   SpO2 94%   Physical Exam Vitals and nursing note reviewed.  Constitutional:      General: She is not in acute distress.    Appearance: She is well-developed. She is not diaphoretic.  HENT:     Head: Normocephalic and atraumatic.     Mouth/Throat:     Mouth: Mucous membranes are moist.     Pharynx: Posterior oropharyngeal erythema present. No oropharyngeal exudate.     Comments:   Posterior pharynx with erythema, no exudate, uvula midline.  Eyes:     General: No scleral icterus.  Conjunctiva/sclera: Conjunctivae normal.  Cardiovascular:     Rate and Rhythm: Normal rate and regular rhythm.     Heart sounds: Normal heart sounds. No murmur heard.  No friction rub. No gallop.   Pulmonary:     Effort: Pulmonary effort is normal. No respiratory distress.     Breath sounds: Normal breath sounds.  Abdominal:     General: Bowel sounds are normal. There is no distension.     Palpations: Abdomen is soft. There is no mass.     Tenderness: There is no abdominal tenderness. There is no guarding.  Musculoskeletal:     Cervical back: Normal range of motion.  Skin:    General: Skin is warm and dry.  Neurological:     Mental Status: She is alert and oriented to person, place, and time.  Psychiatric:        Behavior: Behavior normal.     ED Results / Procedures / Treatments   Labs (all labs ordered are listed, but only abnormal results are displayed) Labs Reviewed    SARS CORONAVIRUS 2 BY RT PCR (HOSPITAL ORDER, PERFORMED IN [facility])  GROUP A STREP BY PCR    EKG None  Radiology No results found.  Procedures Procedures (including critical care time)  Medications Ordered in ED Medications - No data to display  ED Course  I have reviewed the triage vital signs and the nursing notes.  Pertinent labs & imaging results that were available during my care of the patient were reviewed by me and considered in my medical decision making (see chart for details).    MDM Rules/Calculators/A&P                          24 year old female who is 3 months pregnant.  I personally reviewed patient's Covid test which is negative.  Will test for strep as well.  She is otherwise afebrile and hemodynamically stable.  Patient appears ill.  We will follow up on the strep PCR during my shift as the patient has to go pick up her baby.  She appears otherwise stable for discharge.  Discussed outpatient home care, return precautions.  Follow-up with OB/GYN  Final Clinical Impression(s) / ED Diagnoses Final diagnoses:  None        Rx / DC Orders ED Discharge Orders    None       Arthor Captain, PA-C 10/16/19 1541    Terrilee Files, MD 10/16/19 [phone]

## 2019-10-16 NOTE — Discharge Instructions (Addendum)
Take tylenol for pain. Solicite ayuda de inmediato si: La falta de aire empeora. Tiene sntomas intensos o persistentes de: Dolor de [place]. Dolor de odo. Dolor sinusal. Dolor en el pecho. Tiene enfermedad pulmonar crnica junto con cualquiera de estos sntomas: Sibilancias. Tos prolongada. Tos con sangre. Cambio en la mucosidad habitual. Presenta rigidez en el cuello. Tiene cambios en: La visin. La audicin. El razonamiento. El [place] de nimo.

## 2019-10-16 NOTE — ED Triage Notes (Signed)
Pt arrives to ED today by pov with c/o of cough, sore throat, congestion and fever for the last 3 days. Pt has been trying to increase water intake. Pt states she is 3 months pregnant not sure weeks or LMP. Pt denies any abd pain or vaginal bleeding.

## 2019-11-26 LAB — OB RESULTS CONSOLE ABO/RH: RH Type: POSITIVE

## 2019-11-26 LAB — OB RESULTS CONSOLE HEPATITIS B SURFACE ANTIGEN: Hepatitis B Surface Ag: NEGATIVE

## 2019-11-26 LAB — OB RESULTS CONSOLE RPR: RPR: NONREACTIVE

## 2019-11-26 LAB — OB RESULTS CONSOLE HGB/HCT, BLOOD
HCT: 36 (ref 29–41)
Hemoglobin: 12.1

## 2019-11-26 LAB — OB RESULTS CONSOLE VARICELLA ZOSTER ANTIBODY, IGG: Varicella: IMMUNE

## 2019-11-26 LAB — OB RESULTS CONSOLE ANTIBODY SCREEN: Antibody Screen: NEGATIVE

## 2019-11-26 LAB — OB RESULTS CONSOLE GC/CHLAMYDIA
Chlamydia: NEGATIVE
Gonorrhea: NEGATIVE

## 2019-11-26 LAB — HEPATITIS C ANTIBODY: HCV Ab: NEGATIVE

## 2019-11-26 LAB — OB RESULTS CONSOLE RUBELLA ANTIBODY, IGM: Rubella: IMMUNE

## 2020-02-14 LAB — GLUCOSE TOLERANCE, 1 HOUR: Glucose, GTT - 1 Hour: 150 (ref ?–200)

## 2020-02-21 LAB — GLUCOSE TOLERANCE, 3 HOURS
Glucose, GTT - 1 Hour: 191 (ref ?–200)
Glucose, GTT - 2 Hour: 135 (ref ?–140)
Glucose, GTT - 3 Hour: 124 mg/dL (ref ?–140)
Glucose, GTT - Fasting: 101 mg/dL (ref 80–110)

## 2020-02-27 ENCOUNTER — Encounter: Payer: Self-pay | Admitting: *Deleted

## 2020-02-28 ENCOUNTER — Other Ambulatory Visit: Payer: Self-pay

## 2020-02-28 ENCOUNTER — Ambulatory Visit: Payer: Self-pay | Admitting: Registered"

## 2020-02-28 DIAGNOSIS — O24419 Gestational diabetes mellitus in pregnancy, unspecified control: Secondary | ICD-10-CM

## 2020-02-28 DIAGNOSIS — O0990 Supervision of high risk pregnancy, unspecified, unspecified trimester: Secondary | ICD-10-CM

## 2020-02-28 DIAGNOSIS — R7309 Other abnormal glucose: Secondary | ICD-10-CM

## 2020-02-28 DIAGNOSIS — Z758 Other problems related to medical facilities and other health care: Secondary | ICD-10-CM

## 2020-02-28 DIAGNOSIS — Z789 Other specified health status: Secondary | ICD-10-CM

## 2020-03-04 DIAGNOSIS — O24419 Gestational diabetes mellitus in pregnancy, unspecified control: Secondary | ICD-10-CM | POA: Insufficient documentation

## 2020-03-04 NOTE — Progress Notes (Signed)
Interpreter services provided by Filomena Jungling #[id number] and Netty Starring [phone] from [facility] OB visit next week on 03/06/20  Patient was seen on 02/28/20 for Gestational Diabetes self-management. EDD 07/30/19. Patient states no history of GDM. Diet history obtained. Patient eats variety of all food groups. Beverages include water.  Patient reports no structured physical activity.  The following learning objectives were met by the patient :   States the definition of Gestational Diabetes  States why dietary management is important in controlling blood glucose  Describes the effects of carbohydrates on blood glucose levels  Demonstrates ability to create a balanced meal plan  Demonstrates carbohydrate counting   States when to check blood glucose levels  Demonstrates proper blood glucose monitoring techniques  States the effect of stress and exercise on blood glucose levels  States the importance of limiting caffeine and abstaining from alcohol and smoking  Plan:  Aim for 3 Carbohydrate Choices per meal (45 grams) +/- 1 either way  Aim for 1-2 Carbohydrate Choices per snack Begin reading food labels for Total Carbohydrate of foods If OK with your MD, consider  increasing your activity level by walking, Arm Chair Exercises or other activity daily as tolerated Begin checking Blood Glucose before breakfast and 2 hours after first bite of breakfast, lunch and dinner as directed by MD  Bring Log Book/Sheet and meter to every medical appointment  Baby Scripts: (BS 2.0 not capable of glucose management at this time.) Patient to record blood sugar on glucose log sheet  Take medication if directed by MD  Blood glucose monitor given: Prodigy CBG: 147 mg/dL  Patient instructed to monitor glucose levels: FBS: 60 - 95 mg/dl 2 hour: <120 mg/dl  Patient received the following handouts:  Nutrition Diabetes and Pregnancy  Carbohydrate Counting List  Blood glucose Log  Sheet  Patient will be seen for follow-up 1 in weeks or as needed.

## 2020-03-06 ENCOUNTER — Other Ambulatory Visit: Payer: Self-pay

## 2020-03-06 ENCOUNTER — Encounter: Payer: Self-pay | Admitting: *Deleted

## 2020-03-06 ENCOUNTER — Ambulatory Visit (INDEPENDENT_AMBULATORY_CARE_PROVIDER_SITE_OTHER): Payer: Self-pay | Admitting: Obstetrics & Gynecology

## 2020-03-06 ENCOUNTER — Encounter: Payer: Self-pay | Admitting: Obstetrics & Gynecology

## 2020-03-06 VITALS — BP 118/76 | HR 111 | Wt 241.0 lb

## 2020-03-06 DIAGNOSIS — O24419 Gestational diabetes mellitus in pregnancy, unspecified control: Secondary | ICD-10-CM

## 2020-03-06 DIAGNOSIS — Z98891 History of uterine scar from previous surgery: Secondary | ICD-10-CM

## 2020-03-06 DIAGNOSIS — Z3A34 34 weeks gestation of pregnancy: Secondary | ICD-10-CM

## 2020-03-06 DIAGNOSIS — R7309 Other abnormal glucose: Secondary | ICD-10-CM

## 2020-03-06 DIAGNOSIS — O0990 Supervision of high risk pregnancy, unspecified, unspecified trimester: Secondary | ICD-10-CM

## 2020-03-06 MED ORDER — ASPIRIN EC 81 MG PO TBEC: 81.0000 mg | DELAYED_RELEASE_TABLET | Freq: Every day | ORAL | 0 refills | Status: DC

## 2020-03-06 MED ORDER — METFORMIN HCL 500 MG PO TABS: 500.0000 mg | ORAL_TABLET | Freq: Two times a day (BID) | ORAL | 1 refills | Status: DC

## 2020-03-06 NOTE — Progress Notes (Signed)
MFM called with detail [place] appt 03/13/20 @ 0915. Called pt with [employer] interpreter ID [phone] to give appt time. Explained her other appts will be moved, front office notified to reschedule other appts.

## 2020-03-06 NOTE — Progress Notes (Signed)
History:   Shelley Wiggins is a 24 y.o. [id number] at [redacted]w[redacted]d by second trimester ultrasound done 12/17/2019 with [facility].  EDC is 04/14/2019.  She is being seen today for her first obstetrical visit. 9 She is a transfer from the HD due to GDM.  She has been checking BS.  Fasting BS are 96-129.  Breakfast PP:  89-140.  Lunch PP:  127-153.  Dinner PP:  113-148.  Her obstetrical history is significant for gestational diabetes, h/o cesarean section x 2. Patient does intend to breast feed. Pregnancy history fully reviewed.  Patient reports no complaints.      HISTORY: OB History  Gravida Para Term Preterm AB Living  3 2 2  0 0 2  SAB IAB Ectopic Multiple Live Births  0 0 0 0 2    # Outcome Date GA Lbr Len/2nd Weight Sex Delivery Anes PTL Lv  3 Current           2 Term 02/04/18 [redacted]w[redacted]d  7 lb 0.9 oz (3.2 kg) M CS-Vac Spinal  LIV     Name: PEREZ,BOY Rumor     Apgar1: 10  Apgar5: 10  1 Term 02/27/16 [redacted]w[redacted]d  7 lb 5 oz (3.317 kg) F CS-LTranv  N LIV     Complications: Oligohydramnios    Last pap smear was done 01/26/18 and was normal  Past Medical History:  Diagnosis Date  . Calcium deficiency   . H/O cesarean section 2017   in 2018 for oligo/fetal distress   Past Surgical History:  Procedure Laterality Date  . CESAREAN SECTION    . CESAREAN SECTION N/A 02/04/2018   Procedure: CESAREAN SECTION;  Surgeon: 02/06/2018, MD;  Location: [facility] BIRTHING SUITES;  Service: Obstetrics;  Laterality: N/A;   Family History  Problem Relation Age of Onset  . Diabetes Paternal Grandmother   . Diabetes Father   . Diabetes Paternal Aunt    Social History   Tobacco Use  . Smoking status: Never Smoker  . Smokeless tobacco: Never Used  Vaping Use  . Vaping Use: Never used  Substance Use Topics  . Alcohol use: Never  . Drug use: Never   No Known Allergies Current Outpatient Medications on File Prior to Visit  Medication Sig Dispense Refill  . prenatal vitamin  w/FE, FA (PRENATAL 1 + 1) 27-1 MG TABS tablet Take 1 tablet by mouth daily at 12 noon.     No current facility-administered medications on file prior to visit.    Review of Systems Pertinent items noted in HPI and remainder of comprehensive ROS otherwise negative.  Physical Exam:   Vitals:   03/06/20 0843  BP: 118/76  Pulse: (!) 111  Weight: 241 lb (109.3 kg)   Fetal Heart Rate (bpm): 138 Patient informed that the ultrasound is considered a limited obstetric ultrasound and is not intended to be a complete ultrasound exam.  Patient also informed that the ultrasound is not being completed with the intent of assessing for fetal or placental anomalies or any pelvic abnormalities.  Explained that the purpose of today's ultrasound is to assess for fetal heart rate.  Patient acknowledges the purpose of the exam and the limitations of the study. General: well-developed, well-nourished female in no acute distress  Breasts:  normal appearance, no masses or tenderness bilaterally  Skin: normal coloration and turgor, no rashes  Neurologic: oriented, normal, negative, normal mood  Extremities: normal strength, tone, and muscle mass, ROM of all joints is normal  HEENT PERRLA, extraocular  movement intact and sclera clear, anicteric  Neck supple and no masses  Cardiovascular: regular rate and rhythm  Respiratory:  no respiratory distress, normal breath sounds  Abdomen: soft, non-tender; bowel sounds normal; no masses,  no organomegaly  Pelvic: Uterine size:  35 cm    Assessment:    Pregnancy: [id number] Patient Active Problem List   Diagnosis Date Noted  . Gestational diabetes mellitus (GDM), antepartum 03/04/2020  . Supervision of high risk pregnancy, antepartum 02/28/2020  . Abnormal glucose tolerance test (GTT) 02/28/2020  . Decreased fetal movement 02/03/2018  . Previous cesarean delivery affecting pregnancy, antepartum 01/26/2018  . Language barrier 01/26/2018  . Late prenatal care  affecting pregnancy, antepartum, third trimester 01/26/2018     Plan:    1. [redacted] weeks gestation of pregnancy  2. Gestational diabetes mellitus (GDM), antepartum, gestational diabetes method of control unspecified - CBC, CMP, pro/creatinine ratio, HbA1C ordered - Ultrasound ordered - weekly BPP -Starting baby ASA -Starting glucophage 500mg  bid.  3. Abnormal glucose tolerance test (GTT)  4. Supervision of high risk pregnancy, antepartum  5. H/O cesarean section (x 2) -Planning repeat C section  Continue prenatal vitamins. Problem list reviewed and updated. Genetic Screening:  Quad screen done and initially abnormal but after dating change was normal. Ultrasound discussed; fetal anatomic survey: requested. Anticipatory guidance about prenatal visits given including labs, ultrasounds, and testing. Discussed usage of Babyscripts and virtual visits as additional source of managing and completing prenatal visits in midst of coronavirus and pandemic.   Encouraged to complete MyChart Registration for her ability to review results, send requests, and have questions addressed.  The nature of [place] [facility]/Faculty Practice with multiple MDs and Advanced Practice Providers was explained to patient; also emphasized that residents, students are part of our team. Routine obstetric precautions reviewed. Encouraged to seek out care at office or emergency room [facility] MAU preferred) for urgent and/or emergent concerns.    [facility], MD, FACOG Obstetrician & Gynecologist, [facility], [facility]

## 2020-03-06 NOTE — Progress Notes (Signed)
Transferring care from Health department due to GDM. Brought CBG Log/. Called to scheduled [place] with MFM - no appointments until 04/01/20- I asked them to check with supervisor and see if they can work her in sooner and call me back. Given new ob and 28 week ob packets. Shelley Wiggins

## 2020-03-07 LAB — COMPREHENSIVE METABOLIC PANEL
ALT: 15 IU/L (ref 0–32)
AST: 9 IU/L (ref 0–40)
Albumin/Globulin Ratio: 1.3 (ref 1.2–2.2)
Albumin: 3.9 g/dL (ref 3.9–5.0)
Alkaline Phosphatase: 174 IU/L — ABNORMAL HIGH (ref 44–121)
BUN/Creatinine Ratio: 13 (ref 9–23)
BUN: 7 mg/dL (ref 6–20)
Bilirubin Total: 0.2 mg/dL (ref 0.0–1.2)
CO2: 16 mmol/L — ABNORMAL LOW (ref 20–29)
Calcium: 9.5 mg/dL (ref 8.7–10.2)
Chloride: 102 mmol/L (ref 96–106)
Creatinine, Ser: 0.53 mg/dL — ABNORMAL LOW (ref 0.57–1.00)
GFR calc Af Amer: 154 mL/min/{1.73_m2} (ref >59)
GFR calc non Af Amer: 133 mL/min/{1.73_m2} (ref >59)
Globulin, Total: 3.1 g/dL (ref 1.5–4.5)
Glucose: 96 mg/dL (ref 65–99)
Potassium: 4.3 mmol/L (ref 3.5–5.2)
Sodium: 136 mmol/L (ref 134–144)
Total Protein: 7 g/dL (ref 6.0–8.5)

## 2020-03-07 LAB — HEMOGLOBIN A1C
Est. average glucose Bld gHb Est-mCnc: 120 mg/dL
Hgb A1c MFr Bld: 5.8 % — ABNORMAL HIGH (ref 4.8–5.6)

## 2020-03-07 LAB — CBC
Hematocrit: 38.1 % (ref 34.0–46.6)
Hemoglobin: 12.3 g/dL (ref 11.1–15.9)
MCH: 26 pg — ABNORMAL LOW (ref 26.6–33.0)
MCHC: 32.3 g/dL (ref 31.5–35.7)
MCV: 81 fL (ref 79–97)
Platelets: 306 10*3/uL (ref 150–450)
RBC: 4.73 x10E6/uL (ref 3.77–5.28)
RDW: 13.4 % (ref 11.7–15.4)
WBC: 11.4 10*3/uL — ABNORMAL HIGH (ref 3.4–10.8)

## 2020-03-07 LAB — PROTEIN / CREATININE RATIO, URINE
Creatinine, Urine: 17.4 mg/dL
Protein, Ur: <4 mg/dL

## 2020-03-12 NOTE — Addendum Note (Signed)
Addended by: Jill Side on: 03/12/2020 09:37 AM   Modules accepted: Orders

## 2020-03-13 ENCOUNTER — Ambulatory Visit: Payer: Self-pay

## 2020-03-13 ENCOUNTER — Other Ambulatory Visit: Payer: Self-pay

## 2020-03-18 ENCOUNTER — Encounter: Payer: Self-pay | Admitting: *Deleted

## 2020-03-18 ENCOUNTER — Ambulatory Visit: Payer: Self-pay

## 2020-03-18 ENCOUNTER — Ambulatory Visit: Payer: Medicaid Other | Attending: Obstetrics & Gynecology

## 2020-03-18 ENCOUNTER — Other Ambulatory Visit: Payer: Self-pay

## 2020-03-18 ENCOUNTER — Other Ambulatory Visit: Payer: Self-pay | Admitting: *Deleted

## 2020-03-18 ENCOUNTER — Ambulatory Visit: Payer: Medicaid Other | Admitting: *Deleted

## 2020-03-18 DIAGNOSIS — O0993 Supervision of high risk pregnancy, unspecified, third trimester: Secondary | ICD-10-CM | POA: Diagnosis not present

## 2020-03-18 DIAGNOSIS — O99213 Obesity complicating pregnancy, third trimester: Secondary | ICD-10-CM | POA: Diagnosis not present

## 2020-03-18 DIAGNOSIS — O34219 Maternal care for unspecified type scar from previous cesarean delivery: Secondary | ICD-10-CM | POA: Diagnosis not present

## 2020-03-18 DIAGNOSIS — O0990 Supervision of high risk pregnancy, unspecified, unspecified trimester: Secondary | ICD-10-CM

## 2020-03-18 DIAGNOSIS — R7309 Other abnormal glucose: Secondary | ICD-10-CM | POA: Diagnosis not present

## 2020-03-18 DIAGNOSIS — Z363 Encounter for antenatal screening for malformations: Secondary | ICD-10-CM | POA: Diagnosis not present

## 2020-03-18 DIAGNOSIS — O24419 Gestational diabetes mellitus in pregnancy, unspecified control: Secondary | ICD-10-CM

## 2020-03-18 DIAGNOSIS — O24415 Gestational diabetes mellitus in pregnancy, controlled by oral hypoglycemic drugs: Secondary | ICD-10-CM | POA: Diagnosis not present

## 2020-03-18 DIAGNOSIS — Z3A36 36 weeks gestation of pregnancy: Secondary | ICD-10-CM | POA: Insufficient documentation

## 2020-03-18 DIAGNOSIS — O0933 Supervision of pregnancy with insufficient antenatal care, third trimester: Secondary | ICD-10-CM | POA: Diagnosis not present

## 2020-03-20 ENCOUNTER — Other Ambulatory Visit: Payer: Self-pay

## 2020-03-20 ENCOUNTER — Telehealth: Payer: Self-pay

## 2020-03-20 ENCOUNTER — Encounter: Payer: Self-pay | Admitting: Obstetrics & Gynecology

## 2020-03-20 NOTE — Telephone Encounter (Signed)
Calculated the patient's GFE for their upcoming appointment.   Patient will receive it via MyChart. If MyChart is unavailabe, the GFE will be printed out and given to the patient.  

## 2020-03-24 ENCOUNTER — Ambulatory Visit: Payer: Medicaid Other | Attending: Obstetrics and Gynecology | Admitting: *Deleted

## 2020-03-24 ENCOUNTER — Telehealth: Payer: Self-pay

## 2020-03-24 ENCOUNTER — Ambulatory Visit: Payer: Medicaid Other | Admitting: *Deleted

## 2020-03-24 ENCOUNTER — Encounter: Payer: Self-pay | Admitting: *Deleted

## 2020-03-24 ENCOUNTER — Other Ambulatory Visit: Payer: Self-pay

## 2020-03-24 DIAGNOSIS — O24410 Gestational diabetes mellitus in pregnancy, diet controlled: Secondary | ICD-10-CM | POA: Insufficient documentation

## 2020-03-24 DIAGNOSIS — R7309 Other abnormal glucose: Secondary | ICD-10-CM

## 2020-03-24 DIAGNOSIS — Z3A37 37 weeks gestation of pregnancy: Secondary | ICD-10-CM | POA: Insufficient documentation

## 2020-03-24 NOTE — Telephone Encounter (Signed)
Calculated the patient's GFE for their upcoming appointment.   GFE will be mailed to patient.

## 2020-03-24 NOTE — Procedures (Signed)
Shelley Wiggins [birth year] [redacted]w[redacted]d  Fetus A Non-Stress Test Interpretation for 03/24/20  Indication: Gestational Diabetes medication controlled  Fetal Heart Rate A Mode: External Baseline Rate (A): 140 bpm Variability: Moderate Accelerations: 15 x 15 Decelerations: None Multiple birth?: No  Uterine Activity Mode: Palpation,Toco Contraction Frequency (min): None Resting Tone Palpated: Relaxed Resting Time: Adequate  Interpretation (Fetal Testing) Nonstress Test Interpretation: Reactive Comments: Dr. Judeth Cornfield reviewed tracing.

## 2020-03-24 NOTE — Telephone Encounter (Signed)
03/25/20 GFE being mailed

## 2020-03-25 ENCOUNTER — Telehealth: Payer: Self-pay | Admitting: General Practice

## 2020-03-25 NOTE — Telephone Encounter (Signed)
Patient called into front office reporting cough, headache, & congestion. Patient has OB f/u & nst/bpp appointment tomorrow in office. Patient did have reactive NST in MFM yesterday. Discussed with Dr Crissie Reese who states as long as the patient has good fetal movement we can defer testing to next week and do OB visit virtually tomorrow. Asked patient about feeling good fetal movement. She states she feels the baby move more when she lays down or the afternoon/evenings. She reports not a lot of fetal movement during the day but they told her yesterday that the baby was moving well and everything looked good. She denies new onset decreased fetal movement and states baby has been moving like this for about 2 weeks. Discussed with Dr Crissie Reese who states that is okay but we should discuss fetal kick count guidelines with the patient. Advised patient that when she hasn't felt fetal movement for a couple hours she should lay down in a dark,quiet room with no distractions including tv or cellphone. She should eat a snack and get something cold to drink. If after an hour of doing that she still hasn't felt movement, she needs to call and let us know. Discussed deferring in person visit tomorrow and having virtual visit. Advised patient she should get tested for COVID and provided phone number to schedule testing through [facility]. Also advised she go to ER if she begins to experience shortness of breath or difficulty breathing. Patient verbalized understanding to all & had no questions. [employer] interpreter used for spanish interpretation today.

## 2020-03-26 ENCOUNTER — Telehealth (INDEPENDENT_AMBULATORY_CARE_PROVIDER_SITE_OTHER): Payer: Medicaid Other | Admitting: Obstetrics and Gynecology

## 2020-03-26 ENCOUNTER — Other Ambulatory Visit: Payer: Self-pay

## 2020-03-26 ENCOUNTER — Encounter: Payer: Self-pay | Admitting: Obstetrics and Gynecology

## 2020-03-26 DIAGNOSIS — Z818 Family history of other mental and behavioral disorders: Secondary | ICD-10-CM | POA: Insufficient documentation

## 2020-03-26 DIAGNOSIS — O3663X0 Maternal care for excessive fetal growth, third trimester, not applicable or unspecified: Secondary | ICD-10-CM

## 2020-03-26 DIAGNOSIS — Z3A37 37 weeks gestation of pregnancy: Secondary | ICD-10-CM

## 2020-03-26 DIAGNOSIS — O24415 Gestational diabetes mellitus in pregnancy, controlled by oral hypoglycemic drugs: Secondary | ICD-10-CM | POA: Diagnosis not present

## 2020-03-26 DIAGNOSIS — Z98891 History of uterine scar from previous surgery: Secondary | ICD-10-CM

## 2020-03-26 DIAGNOSIS — O0993 Supervision of high risk pregnancy, unspecified, third trimester: Secondary | ICD-10-CM

## 2020-03-26 DIAGNOSIS — Z789 Other specified health status: Secondary | ICD-10-CM

## 2020-03-26 DIAGNOSIS — O0990 Supervision of high risk pregnancy, unspecified, unspecified trimester: Secondary | ICD-10-CM

## 2020-03-26 DIAGNOSIS — O3660X0 Maternal care for excessive fetal growth, unspecified trimester, not applicable or unspecified: Secondary | ICD-10-CM | POA: Insufficient documentation

## 2020-03-26 DIAGNOSIS — Z603 Acculturation difficulty: Secondary | ICD-10-CM

## 2020-03-26 NOTE — Progress Notes (Addendum)
TELEHEALTH VIRTUAL OBSTETRICS VISIT ENCOUNTER NOTE  Clinic: [facility]  I connected with Shelley Wiggins on 03/26/20 at  9:00 AM EST by telephone at home and verified that I am speaking with the correct person using two identifiers.   I discussed the limitations, risks, security and privacy concerns of performing an evaluation and management service by telephone and the availability of in person appointments. I also discussed with the patient that there may be a patient responsible charge related to this service. The patient expressed understanding and agreed to proceed.  Subjective:  Shelley Wiggins is a 25 y.o. G3P2002 at [redacted]w[redacted]d being followed for ongoing prenatal care.  She is currently monitored for the following issues for this high-risk pregnancy and has Language barrier; Late prenatal care affecting pregnancy, antepartum, third trimester; Supervision of high risk pregnancy, antepartum; Gestational diabetes mellitus (GDM), antepartum; H/O cesarean section; LGA (large for gestational age) fetus affecting management of mother; and Family history of autism on their problem list.  Patient reports congestion, URI s/s. Reports fetal movement. Denies any contractions, bleeding or leaking of fluid.   The following portions of the patient's history were reviewed and updated as appropriate: allergies, current medications, past family history, past medical history, past social history, past surgical history and problem list.   Objective:  There were no vitals filed for this visit.  Babyscripts Data Reviewed: not applicable  General:  Alert, oriented and cooperative.   Mental Status: Normal mood and affect perceived. Normal judgment and thought content.  Rest of physical exam deferred due to type of encounter  Assessment and Plan:  Pregnancy: G3P2002 at [redacted]w[redacted]d 1. Supervision of high risk pregnancy, antepartum Depo provera  2.  Gestational diabetes mellitus (GDM) controlled on oral hypoglycemic drug, antepartum AM fastings in the mid 90s, 2h PP 120s-150s on metformin 500 with lunch.  Since I had to increase her medications, will see patient back for nst later this week (nst/bpp cancelled for today due to her s/s and need for testing) and to check her CBG log; she had a reactive nst on Monday. Pt increased to 1000 with morning and dinner.   3. Excessive fetal growth affecting management of pregnancy in third trimester, single or unspecified fetus  4. Language barrier Interpreter used  5. H/O cesarean section Not already scheduled. Request sent for 1/23 rpt c/s.   6. [redacted] weeks gestation of pregnancy  7. URI s/s D/w her importnace of testing and dx and s/s to watch for.   Term labor symptoms and general obstetric precautions including but not limited to vaginal bleeding, contractions, leaking of fluid and fetal movement were reviewed in detail with the patient.  I discussed the assessment and treatment plan with the patient. The patient was provided an opportunity to ask questions and all were answered. The patient agreed with the plan and demonstrated an understanding of the instructions. The patient was advised to call back or seek an in-person office evaluation/go to MAU at [facility] for any urgent or concerning symptoms. Please refer to After Visit Summary for other counseling recommendations.   I provided 10 minutes of non-face-to-face time during this encounter. The visit was conducted via Telephone-medicine  Return in about 2 days (around 03/28/2020) for in person.  Future Appointments  Date Time Provider Department Center  03/27/2020  9:15 AM [facility] TESTING [facility] None  03/31/2020  1:30 PM [facility] NURSE [facility]  03/31/2020  1:45 PM [facility] US6 [facility]  [facility]  03/31/2020  2:15 PM [facility] NST [facility]    [place] Bing, MD [facility] [employer], [facility]

## 2020-03-26 NOTE — Progress Notes (Signed)
I connected with  Shelley Wiggins on 03/26/20 at  9:00 AM EST by telephone and verified that I am speaking with the correct person using two identifiers.   I discussed the limitations, risks, security and privacy concerns of performing an evaluation and management service by telephone and the availability of in person appointments. I also discussed with the patient that there may be a patient responsible charge related to this service. The patient expressed understanding and agreed to proceed.  [name], RN 03/26/2020  9:28 AM   Patient reports fever, cough, sore throat, & congestion since Tuesday. Discussed going for COVID testing and provided phone number

## 2020-03-27 ENCOUNTER — Other Ambulatory Visit: Payer: Self-pay

## 2020-03-27 NOTE — Patient Instructions (Signed)
Instrucciones Para Antes de la Ciruga   Su ciruga est programada para 04/06/2020  (your procedure is scheduled on) Entre por la entrada principal del [facility]  a las [address] de [address] [place] -(enter through the main entrance at [facility] at [employer] AM    [facility] [address] [place] [id number] para informarnos de su llegada. (pick up phone, dial [phone] on arrival)     Por favor llame al [phone] si tiene algn problema en la maana de la ciruga (please call this number if you have any problems the morning of surgery.)                  Recuerde: (Remember)  No coma alimentos. (Do not eat food (After Midnight) Desps de medianoche)    No tome lquidos claros. (Do not drink clear liquids (After Midnight) Desps de medianoche)    No use joyas, maquillaje de ojos, lpiz labial, crema para el cuerpo o esmalte de uas oscuro. (Do not wear jewelry, eye makeup, lipstick, body lotion, or dark fingernail polish). Puede usar desodorante (you may wear deodorant)    No se afeite 48 horas de su ciruga. (Do not shave 48 hours before your surgery)    No traiga objetos de valor al hospital.  [place] no se hace responsable de ninguna pertenencia, ni objetos de valor que haya trado al hospital. (Do not bring valuable to the hospital.  [place] [facility] is not responsible for any belongings or valuables brought to the hospital)   [facility] medicinas en la maana de la ciruga con un SORBITO de agua nada (take these meds the morning of surgery with a SIP of water)     Durante la ciruga no se pueden usar lentes de contacto, dentaduras o puentes. (Contacts, dentures or bridgework cannot be worn in surgery).   Si va a ser ingresado despus de la ciruga, deje la [employer] en el carro hasta que se le haya asignado una habitacin. (If you are to be admitted after surgery, leave suitcase in car until your room has been assigned.)   A los pacientes que se les d de  alta el mismo da no se les permitir manejar a casa.  (Patients discharged on the day of surgery will not be allowed to drive home)    [place] [employer], [employer]. (Name and telephone number of your driver)   Instrucciones especiales N/A (Special Instructions)   Por favor, lea las hojas informativas que le entregaron. (Please read over the following fact sheets that you were given) Surgical Site Infection Prevention

## 2020-03-28 ENCOUNTER — Other Ambulatory Visit: Payer: Self-pay

## 2020-03-28 ENCOUNTER — Encounter: Payer: Self-pay | Admitting: *Deleted

## 2020-03-28 ENCOUNTER — Telehealth (HOSPITAL_COMMUNITY): Payer: Self-pay | Admitting: *Deleted

## 2020-03-28 ENCOUNTER — Encounter: Payer: Self-pay | Admitting: Family Medicine

## 2020-03-28 ENCOUNTER — Telehealth: Payer: Self-pay | Admitting: *Deleted

## 2020-03-28 NOTE — Progress Notes (Signed)
Patient was scheduled for 1015 am NST appointment today.  Patient had covid like symptoms on 03/24/20.  Staff tried to call patient to move her appointment to 1115 am.  Patient's phone wasn't accepting calls.  Patient arrived for appointment as scheduled.  Interpreter Fleet Contras instructed patient that we couldn't see her until 1115 am today since she had covid like symptoms on Monday.  Asked patient to wait until 1115 am.  Patient states she cannot wait until 1115 am as she left her child with a friend.  Patient was rescheduled for appointment with NST only and MD on Tuesday at 315 pm.  MFM appointment changed to 04/04/20 for NST/BPP.  Has C-Section scheduled for 04/06/20.  Interpreter Fleet Contras explained new appointments to patient.  Patient states understanding.

## 2020-03-28 NOTE — Pre-Procedure Instructions (Signed)
Interpreter number [phone]

## 2020-03-28 NOTE — Progress Notes (Signed)
Spoke with Dr. Judeth Cornfield regarding patients appointments.  Dr. Judeth Cornfield reviewed the chart.  States since patient had NST on Monday 03/24/20, it's okay to wait until 04/01/20 for next NST.  Asked for BPP to be scheduled with MFM on 04/01/20.  Appointment scheduled.

## 2020-03-28 NOTE — Telephone Encounter (Signed)
Preadmission screen  

## 2020-03-28 NOTE — Telephone Encounter (Signed)
Called pt w/[employer] interpreter [phone] regarding appointments for today and heard message stating that the person called is not accepting calls at this time. I was unable to leave a message for pt.

## 2020-03-31 ENCOUNTER — Ambulatory Visit: Payer: Self-pay

## 2020-04-01 ENCOUNTER — Ambulatory Visit: Payer: Medicaid Other | Admitting: *Deleted

## 2020-04-01 ENCOUNTER — Telehealth (HOSPITAL_COMMUNITY): Payer: Self-pay | Admitting: *Deleted

## 2020-04-01 ENCOUNTER — Ambulatory Visit: Payer: Medicaid Other

## 2020-04-01 ENCOUNTER — Other Ambulatory Visit: Payer: Self-pay

## 2020-04-01 ENCOUNTER — Encounter: Payer: Self-pay | Admitting: Obstetrics and Gynecology

## 2020-04-01 ENCOUNTER — Ambulatory Visit: Payer: Medicaid Other | Attending: Maternal & Fetal Medicine

## 2020-04-01 ENCOUNTER — Ambulatory Visit (INDEPENDENT_AMBULATORY_CARE_PROVIDER_SITE_OTHER): Payer: Medicaid Other | Admitting: Obstetrics and Gynecology

## 2020-04-01 ENCOUNTER — Encounter: Payer: Self-pay | Admitting: *Deleted

## 2020-04-01 VITALS — BP 132/69 | HR 90

## 2020-04-01 DIAGNOSIS — O0990 Supervision of high risk pregnancy, unspecified, unspecified trimester: Secondary | ICD-10-CM

## 2020-04-01 DIAGNOSIS — O24415 Gestational diabetes mellitus in pregnancy, controlled by oral hypoglycemic drugs: Secondary | ICD-10-CM

## 2020-04-01 DIAGNOSIS — Z789 Other specified health status: Secondary | ICD-10-CM | POA: Diagnosis not present

## 2020-04-01 DIAGNOSIS — Z758 Other problems related to medical facilities and other health care: Secondary | ICD-10-CM

## 2020-04-01 DIAGNOSIS — Z98891 History of uterine scar from previous surgery: Secondary | ICD-10-CM

## 2020-04-01 NOTE — Progress Notes (Signed)
C/o" intermittent pain(burning, itching) at C/S scar x 3-4 days."

## 2020-04-01 NOTE — Progress Notes (Signed)
   PRENATAL VISIT NOTE  Subjective:  Shelley Wiggins is a 25 y.o. G3P2002 at [redacted]w[redacted]d being seen today for ongoing prenatal care.  She is currently monitored for the following issues for this high-risk pregnancy and has Language barrier; Late prenatal care affecting pregnancy, antepartum, third trimester; Supervision of high risk pregnancy, antepartum; Gestational diabetes mellitus (GDM), antepartum; H/O cesarean section; LGA (large for gestational age) fetus affecting management of mother; and Family history of autism on their problem list.  Patient reports no complaints.   .  .   . Denies leaking of fluid.   The following portions of the patient's history were reviewed and updated as appropriate: allergies, current medications, past family history, past medical history, past social history, past surgical history and problem list.   Objective:  There were no vitals filed for this visit.  Fetal Status:           General:  Alert, oriented and cooperative. Patient is in no acute distress.  Skin: Skin is warm and dry. No rash noted.   Cardiovascular: Normal heart rate noted  Respiratory: Normal respiratory effort, no problems with respiration noted  Abdomen: Soft, gravid, appropriate for gestational age.        Pelvic: Cervical exam deferred        Extremities: Normal range of motion.     Mental Status: Normal mood and affect. Normal behavior. Normal judgment and thought content.   Assessment and Plan:  Pregnancy: G3P2002 at [redacted]w[redacted]d 1. Supervision of high risk pregnancy, antepartum Patient is doing well with intermittent burning and itching at c-section scar  2. Gestational diabetes mellitus (GDM) controlled on oral hypoglycemic drug, antepartum CBgs reviewed and patient reports fasting in the mid 90's and pp 130. Patient admits to dietary indiscretions. She is not interested in starting insulin at this time. She plans to improve her diet until her delivery NST reviewed and  reactive with baseline 150, mod variability, +accels, no decels Fetal testing with MFM on 04/04/20  3. H/O cesarean section Patient scheduled for repeat on 04/06/20  4. Language barrier Spanish interpreter present   Term labor symptoms and general obstetric precautions including but not limited to vaginal bleeding, contractions, leaking of fluid and fetal movement were reviewed in detail with the patient. Please refer to After Visit Summary for other counseling recommendations.   Return in about 4 weeks (around 04/29/2020) for postpartum.  Future Appointments  Date Time Provider Department Center  04/04/2020  9:30 AM [facility]-LD PAT 1 [facility]-INDC None  04/04/2020 10:30 AM MC-SCREENING MC-SDSC None  04/04/2020  1:00 PM [facility] NURSE [facility]  04/04/2020  1:15 PM [facility] US2 [facility]  04/04/2020  2:15 PM [facility] NST [facility]    Catalina Antigua, MD

## 2020-04-01 NOTE — Telephone Encounter (Signed)
Preadmission screen  

## 2020-04-01 NOTE — Pre-Procedure Instructions (Signed)
[phone] interpreter number

## 2020-04-02 ENCOUNTER — Other Ambulatory Visit: Payer: Self-pay

## 2020-04-02 ENCOUNTER — Encounter (HOSPITAL_COMMUNITY): Payer: Self-pay

## 2020-04-02 ENCOUNTER — Telehealth (HOSPITAL_COMMUNITY): Payer: Self-pay | Admitting: *Deleted

## 2020-04-02 NOTE — H&P (Signed)
Shelley Wiggins is an 25 y.o. [phone] [redacted]w[redacted]d female.   Chief Complaint: Prior C-section x 2 HPI: Prior C-section x 2. 39 wks, GDM A2  Past Medical History:  Diagnosis Date  . Calcium deficiency   . Gestational diabetes     Past Surgical History:  Procedure Laterality Date  . CESAREAN SECTION    . CESAREAN SECTION N/A 02/04/2018   Procedure: CESAREAN SECTION;  Surgeon: Max Bing, MD;  Location: [facility] BIRTHING SUITES;  Service: Obstetrics;  Laterality: N/A;    Family History  Problem Relation Age of Onset  . Diabetes Paternal Grandmother   . Diabetes Father   . Diabetes Paternal Aunt    Social History:  reports that she has never smoked. She has never used smokeless tobacco. She reports that she does not drink alcohol and does not use drugs.   No Known Allergies  No medications prior to admission.     A comprehensive review of systems was negative.  Last menstrual period 07/30/2019, unknown if currently breastfeeding. LMP 07/30/2019 (Approximate)  General appearance: alert, cooperative and appears stated age Head: Normocephalic, without obvious abnormality, atraumatic Neck: supple, symmetrical, trachea midline Lungs: normal effort Heart: regular rate and rhythm Abdomen: gravid, Non-tender Extremities: Homans sign is negative, no sign of DVT Skin: Skin color, texture, turgor normal. No rashes or lesions Neurologic: Grossly normal   Lab Results  Component Value Date   WBC 11.4 (H) 03/06/2020   HGB 12.3 03/06/2020   HCT 38.1 03/06/2020   MCV 81 03/06/2020   PLT 306 03/06/2020         ABO, Rh: O/Positive/-- (09/13 0000)  Antibody: Negative (09/13 0000)  Rubella: Immune (09/13 0000)  RPR: Nonreactive (09/13 0000)  HBsAg: Negative (09/13 0000)  HIV:    GBS:       Assessment/Plan Principal Problem:   H/O cesarean section Active Problems:   Language barrier   Gestational diabetes mellitus (GDM), antepartum   LGA (large for gestational  age) fetus affecting management of mother  For RCS at 85 wks. Risks include but are not limited to bleeding, infection, injury to surrounding structures, including bowel, bladder and ureters, blood clots, and death.  Likelihood of success is high.   Reva Bores 04/02/2020, 1:18 PM

## 2020-04-02 NOTE — Pre-Procedure Instructions (Signed)
Interpreter number [phone]

## 2020-04-03 ENCOUNTER — Other Ambulatory Visit (HOSPITAL_COMMUNITY)
Admission: RE | Admit: 2020-04-03 | Discharge: 2020-04-03 | Disposition: A | Discharge status: Home / Self Care | Payer: Medicaid Other | Source: Ambulatory Visit | Attending: Family Medicine | Admitting: Family Medicine

## 2020-04-03 ENCOUNTER — Encounter (HOSPITAL_COMMUNITY): Admission: RE | Admit: 2020-04-03 | Payer: Self-pay | Source: Ambulatory Visit

## 2020-04-03 DIAGNOSIS — U071 COVID-19: Secondary | ICD-10-CM | POA: Insufficient documentation

## 2020-04-03 DIAGNOSIS — Z01812 Encounter for preprocedural laboratory examination: Secondary | ICD-10-CM | POA: Diagnosis not present

## 2020-04-03 LAB — SARS CORONAVIRUS 2 (TAT 6-24 HRS): SARS Coronavirus 2: POSITIVE — AB

## 2020-04-03 NOTE — Telephone Encounter (Signed)
[phone] interpreter number

## 2020-04-04 ENCOUNTER — Telehealth: Payer: Self-pay

## 2020-04-04 ENCOUNTER — Ambulatory Visit: Payer: Medicaid Other

## 2020-04-04 ENCOUNTER — Other Ambulatory Visit: Payer: Self-pay

## 2020-04-04 ENCOUNTER — Ambulatory Visit: Payer: Medicaid Other | Admitting: *Deleted

## 2020-04-04 ENCOUNTER — Other Ambulatory Visit (HOSPITAL_COMMUNITY): Payer: Medicaid Other

## 2020-04-04 ENCOUNTER — Telehealth: Payer: Self-pay | Admitting: Family Medicine

## 2020-04-04 ENCOUNTER — Encounter: Payer: Self-pay | Admitting: *Deleted

## 2020-04-04 ENCOUNTER — Encounter (HOSPITAL_COMMUNITY)
Admission: RE | Admit: 2020-04-04 | Discharge: 2020-04-04 | Disposition: A | Discharge status: Home / Self Care | Payer: Medicaid Other | Source: Ambulatory Visit | Attending: Family Medicine | Admitting: Family Medicine

## 2020-04-04 ENCOUNTER — Ambulatory Visit (HOSPITAL_BASED_OUTPATIENT_CLINIC_OR_DEPARTMENT_OTHER): Payer: Medicaid Other

## 2020-04-04 VITALS — BP 124/60 | HR 92

## 2020-04-04 DIAGNOSIS — O34219 Maternal care for unspecified type scar from previous cesarean delivery: Secondary | ICD-10-CM | POA: Diagnosis not present

## 2020-04-04 DIAGNOSIS — Z363 Encounter for antenatal screening for malformations: Secondary | ICD-10-CM | POA: Diagnosis not present

## 2020-04-04 DIAGNOSIS — Z3A38 38 weeks gestation of pregnancy: Secondary | ICD-10-CM | POA: Insufficient documentation

## 2020-04-04 DIAGNOSIS — O0933 Supervision of pregnancy with insufficient antenatal care, third trimester: Secondary | ICD-10-CM | POA: Diagnosis not present

## 2020-04-04 DIAGNOSIS — O24415 Gestational diabetes mellitus in pregnancy, controlled by oral hypoglycemic drugs: Secondary | ICD-10-CM | POA: Insufficient documentation

## 2020-04-04 DIAGNOSIS — O368130 Decreased fetal movements, third trimester, not applicable or unspecified: Secondary | ICD-10-CM | POA: Insufficient documentation

## 2020-04-04 DIAGNOSIS — E669 Obesity, unspecified: Secondary | ICD-10-CM | POA: Insufficient documentation

## 2020-04-04 DIAGNOSIS — O99213 Obesity complicating pregnancy, third trimester: Secondary | ICD-10-CM | POA: Insufficient documentation

## 2020-04-04 DIAGNOSIS — O24419 Gestational diabetes mellitus in pregnancy, unspecified control: Secondary | ICD-10-CM

## 2020-04-04 HISTORY — DX: Gestational diabetes mellitus in pregnancy, unspecified control: O24.419

## 2020-04-04 NOTE — Telephone Encounter (Signed)
Left message about test results--has been notified of + COVID--have not explained how surgery will go given this.    By Reva Bores, MD

## 2020-04-04 NOTE — Telephone Encounter (Signed)
Called to discuss with patient about COVID-19 symptoms and the use of one of the available treatments for those with mild to moderate Covid symptoms and at a high risk of hospitalization.  Pt appears to qualify for outpatient treatment due to co-morbid conditions and/or a member of an at-risk group in accordance with the FDA Emergency Use Authorization.    Symptom onset: No symptoms Vaccinated: No Booster? No Immunocompromised? No Qualifiers: None  Unable to reach pt - Reached pt. States she is "fine and I don't have any symptoms."  Shelley Wiggins

## 2020-04-04 NOTE — Progress Notes (Signed)
Patient tested positive for COVID on 04/04/19, left message on nurse line at [phone] about patients positive result

## 2020-04-05 ENCOUNTER — Encounter (HOSPITAL_COMMUNITY): Payer: Self-pay | Admitting: Obstetrics and Gynecology

## 2020-04-05 ENCOUNTER — Inpatient Hospital Stay (EMERGENCY_DEPARTMENT_HOSPITAL)
Admission: AD | Admit: 2020-04-05 | Discharge: 2020-04-05 | Disposition: A | Discharge status: Home / Self Care | Payer: Medicaid Other | Source: Home / Self Care | Attending: Obstetrics and Gynecology | Admitting: Obstetrics and Gynecology

## 2020-04-05 DIAGNOSIS — O471 False labor at or after 37 completed weeks of gestation: Secondary | ICD-10-CM | POA: Insufficient documentation

## 2020-04-05 DIAGNOSIS — Z3A38 38 weeks gestation of pregnancy: Secondary | ICD-10-CM | POA: Insufficient documentation

## 2020-04-05 DIAGNOSIS — O479 False labor, unspecified: Secondary | ICD-10-CM

## 2020-04-05 DIAGNOSIS — O34219 Maternal care for unspecified type scar from previous cesarean delivery: Secondary | ICD-10-CM

## 2020-04-05 NOTE — MAU Provider Note (Signed)
S: Ms. Shelley Wiggins is a 25 y.o. [phone] at [redacted]w[redacted]d  who presents to MAU today complaining contractions with irregular pattern since last night. She endorses vaginal bleeding that she notices when she wipes. She denies LOF. She reports normal fetal movement.    O: BP 121/74 (BP Location: Right Arm)   Pulse 100   Temp 98.4 F (36.9 C) (Oral)   Resp 20   Ht 5\' 6"  (1.676 m)   Wt 104.3 kg   LMP 07/30/2019 (Approximate)   BMI 37.12 kg/m  GENERAL: Well-developed, well-nourished female in no acute distress.  HEAD: Normocephalic, atraumatic.  CHEST: Normal effort of breathing, regular heart rate ABDOMEN: Soft, nontender, gravid  Cervical exam:  Dilation: 1 Effacement (%): 50 Station: -3 Exam by:: [email or web address], RN   Fetal Monitoring: Baseline: 135 bpm Variability: moderate  Accelerations: 15x15 Decelerations: None Contractions: Occasional, irregular pattern   MDM:  Cervix essentially unchanged per RN. Observed patient's pad- scant dark red mucoid blood consistent with bloody show. Reviewed patient with Dr. Lestine Box, ok for DC home with strict return precautions.    A: SIUP at [redacted]w[redacted]d  False labor  P: Discharge home in stable condition  Return to MAU if symptoms worsen  Return tomorrow for your regular scheduled C/S   [redacted]w[redacted]d I, NP 04/05/2020 9:54 AM

## 2020-04-05 NOTE — MAU Note (Signed)
..  Shelley Wiggins is a 25 y.o. at [redacted]w[redacted]d here in MAU reporting: bleeding when she wipes. She states the bleeding began around 9pm, describes the bleeding as light red with some white discharge. Reports occasional contractions rating them 3/10.+FM  Last intercourse: more than 5-6 months.

## 2020-04-05 NOTE — Progress Notes (Signed)
Interpreter called pt and pt didn't answer phone. Pt left message in Spanish re: due to positive Covid result: only 1 visitor, no children, no valet parking, meals would be provided for support person, and pt would go to OR and support person could remain in Recovery. Number left if pt had questions.

## 2020-04-06 ENCOUNTER — Inpatient Hospital Stay (HOSPITAL_COMMUNITY): Payer: Medicaid Other | Admitting: Anesthesiology

## 2020-04-06 ENCOUNTER — Encounter (HOSPITAL_COMMUNITY)
Admission: RE | Disposition: A | Discharge status: Home / Self Care | Payer: Self-pay | Source: Home / Self Care | Attending: Family Medicine

## 2020-04-06 ENCOUNTER — Other Ambulatory Visit: Payer: Self-pay

## 2020-04-06 ENCOUNTER — Encounter (HOSPITAL_COMMUNITY): Payer: Self-pay | Admitting: Family Medicine

## 2020-04-06 ENCOUNTER — Inpatient Hospital Stay (HOSPITAL_COMMUNITY)
Admission: RE | Admit: 2020-04-06 | Discharge: 2020-04-08 | DRG: 788 | Disposition: A | Discharge status: Home / Self Care | Payer: Medicaid Other | Attending: Family Medicine | Admitting: Family Medicine

## 2020-04-06 DIAGNOSIS — O24419 Gestational diabetes mellitus in pregnancy, unspecified control: Secondary | ICD-10-CM | POA: Diagnosis present

## 2020-04-06 DIAGNOSIS — Z98891 History of uterine scar from previous surgery: Secondary | ICD-10-CM | POA: Diagnosis not present

## 2020-04-06 DIAGNOSIS — D649 Anemia, unspecified: Secondary | ICD-10-CM | POA: Diagnosis not present

## 2020-04-06 DIAGNOSIS — O24425 Gestational diabetes mellitus in childbirth, controlled by oral hypoglycemic drugs: Secondary | ICD-10-CM | POA: Diagnosis present

## 2020-04-06 DIAGNOSIS — U071 COVID-19: Secondary | ICD-10-CM | POA: Diagnosis not present

## 2020-04-06 DIAGNOSIS — O3663X0 Maternal care for excessive fetal growth, third trimester, not applicable or unspecified: Secondary | ICD-10-CM | POA: Diagnosis present

## 2020-04-06 DIAGNOSIS — O471 False labor at or after 37 completed weeks of gestation: Secondary | ICD-10-CM | POA: Diagnosis not present

## 2020-04-06 DIAGNOSIS — O26893 Other specified pregnancy related conditions, third trimester: Secondary | ICD-10-CM | POA: Diagnosis present

## 2020-04-06 DIAGNOSIS — Z8759 Personal history of other complications of pregnancy, childbirth and the puerperium: Secondary | ICD-10-CM | POA: Diagnosis not present

## 2020-04-06 DIAGNOSIS — Z3A39 39 weeks gestation of pregnancy: Secondary | ICD-10-CM

## 2020-04-06 DIAGNOSIS — O34211 Maternal care for low transverse scar from previous cesarean delivery: Secondary | ICD-10-CM | POA: Diagnosis present

## 2020-04-06 DIAGNOSIS — O24439 Gestational diabetes mellitus in the puerperium, unspecified control: Secondary | ICD-10-CM | POA: Diagnosis not present

## 2020-04-06 DIAGNOSIS — O9853 Other viral diseases complicating the puerperium: Secondary | ICD-10-CM | POA: Diagnosis not present

## 2020-04-06 DIAGNOSIS — O34219 Maternal care for unspecified type scar from previous cesarean delivery: Secondary | ICD-10-CM | POA: Diagnosis present

## 2020-04-06 DIAGNOSIS — O9903 Anemia complicating the puerperium: Secondary | ICD-10-CM | POA: Diagnosis not present

## 2020-04-06 DIAGNOSIS — Z8616 Personal history of COVID-19: Secondary | ICD-10-CM | POA: Diagnosis not present

## 2020-04-06 DIAGNOSIS — O24429 Gestational diabetes mellitus in childbirth, unspecified control: Secondary | ICD-10-CM

## 2020-04-06 DIAGNOSIS — O98513 Other viral diseases complicating pregnancy, third trimester: Secondary | ICD-10-CM

## 2020-04-06 DIAGNOSIS — Z603 Acculturation difficulty: Secondary | ICD-10-CM | POA: Diagnosis present

## 2020-04-06 DIAGNOSIS — O3660X0 Maternal care for excessive fetal growth, unspecified trimester, not applicable or unspecified: Secondary | ICD-10-CM | POA: Diagnosis present

## 2020-04-06 DIAGNOSIS — Z3A38 38 weeks gestation of pregnancy: Secondary | ICD-10-CM

## 2020-04-06 DIAGNOSIS — Z789 Other specified health status: Secondary | ICD-10-CM | POA: Diagnosis present

## 2020-04-06 HISTORY — DX: Other abnormalities of breathing: R06.89

## 2020-04-06 LAB — CBC
HCT: 39.6 % (ref 36.0–46.0)
Hemoglobin: 12.8 g/dL (ref 12.0–15.0)
MCH: 26.3 pg (ref 26.0–34.0)
MCHC: 32.3 g/dL (ref 30.0–36.0)
MCV: 81.3 fL (ref 80.0–100.0)
Platelets: 230 10*3/uL (ref 150–400)
RBC: 4.87 MIL/uL (ref 3.87–5.11)
RDW: 15.5 % (ref 11.5–15.5)
WBC: 11 10*3/uL — ABNORMAL HIGH (ref 4.0–10.5)
nRBC: 0 % (ref 0.0–0.2)

## 2020-04-06 LAB — RPR: RPR Ser Ql: NONREACTIVE

## 2020-04-06 LAB — TYPE AND SCREEN
ABO/RH(D): O POS
Antibody Screen: NEGATIVE

## 2020-04-06 LAB — GLUCOSE, CAPILLARY: Glucose-Capillary: 96 mg/dL (ref 70–99)

## 2020-04-06 SURGERY — Surgical Case
Anesthesia: Spinal

## 2020-04-06 MED ORDER — ACETAMINOPHEN 325 MG PO TABS
650.0000 mg | ORAL_TABLET | ORAL | Status: DC | PRN
  Filled 2020-04-06: qty 2

## 2020-04-06 MED ORDER — METOCLOPRAMIDE HCL 5 MG/ML IJ SOLN
INTRAMUSCULAR | Status: AC
  Filled 2020-04-06: qty 2

## 2020-04-06 MED ORDER — SCOPOLAMINE 1 MG/3DAYS TD PT72
MEDICATED_PATCH | TRANSDERMAL | Status: DC | PRN
  Administered 2020-04-06: 1 via TRANSDERMAL

## 2020-04-06 MED ORDER — OXYTOCIN-SODIUM CHLORIDE 30-0.9 UT/500ML-% IV SOLN
INTRAVENOUS | Status: AC
  Filled 2020-04-06: qty 500

## 2020-04-06 MED ORDER — SCOPOLAMINE 1 MG/3DAYS TD PT72: 1.0000 | MEDICATED_PATCH | Freq: Once | TRANSDERMAL | Status: DC

## 2020-04-06 MED ORDER — CEFAZOLIN SODIUM-DEXTROSE 2-4 GM/100ML-% IV SOLN: 2.0000 g | INTRAVENOUS | Status: DC

## 2020-04-06 MED ORDER — MENTHOL 3 MG MT LOZG: 1.0000 | LOZENGE | OROMUCOSAL | Status: DC | PRN

## 2020-04-06 MED ORDER — PHENYLEPHRINE 40 MCG/ML (10ML) SYRINGE FOR IV PUSH (FOR BLOOD PRESSURE SUPPORT)
PREFILLED_SYRINGE | INTRAVENOUS | Status: AC
  Filled 2020-04-06: qty 10

## 2020-04-06 MED ORDER — LACTATED RINGERS IV SOLN: INTRAVENOUS | Status: DC | PRN

## 2020-04-06 MED ORDER — METOCLOPRAMIDE HCL 5 MG/ML IJ SOLN
INTRAMUSCULAR | Status: DC | PRN
  Administered 2020-04-06: 10 mg via INTRAVENOUS

## 2020-04-06 MED ORDER — DIPHENHYDRAMINE HCL 25 MG PO CAPS: 25.0000 mg | ORAL_CAPSULE | Freq: Four times a day (QID) | ORAL | Status: DC | PRN

## 2020-04-06 MED ORDER — ENOXAPARIN SODIUM 60 MG/0.6ML ~~LOC~~ SOLN
50.0000 mg | SUBCUTANEOUS | Status: DC
  Administered 2020-04-07 (×2): 50 mg via SUBCUTANEOUS
  Filled 2020-04-06 (×2): qty 0.6

## 2020-04-06 MED ORDER — KETOROLAC TROMETHAMINE 30 MG/ML IJ SOLN
30.0000 mg | Freq: Four times a day (QID) | INTRAMUSCULAR | Status: AC
  Administered 2020-04-07 (×2): 30 mg via INTRAVENOUS
  Filled 2020-04-06 (×3): qty 1

## 2020-04-06 MED ORDER — MEDROXYPROGESTERONE ACETATE 150 MG/ML IM SUSP
150.0000 mg | Freq: Once | INTRAMUSCULAR | Status: AC
  Administered 2020-04-08: 150 mg via INTRAMUSCULAR
  Filled 2020-04-06: qty 1

## 2020-04-06 MED ORDER — NALBUPHINE HCL 10 MG/ML IJ SOLN: 5.0000 mg | Freq: Once | INTRAMUSCULAR | Status: DC | PRN

## 2020-04-06 MED ORDER — NALBUPHINE HCL 10 MG/ML IJ SOLN: 5.0000 mg | INTRAMUSCULAR | Status: DC | PRN

## 2020-04-06 MED ORDER — NALOXONE HCL 0.4 MG/ML IJ SOLN: 0.4000 mg | INTRAMUSCULAR | Status: DC | PRN

## 2020-04-06 MED ORDER — OXYTOCIN-SODIUM CHLORIDE 30-0.9 UT/500ML-% IV SOLN
INTRAVENOUS | Status: DC | PRN
  Administered 2020-04-06: 300 mL via INTRAVENOUS

## 2020-04-06 MED ORDER — ACETAMINOPHEN 500 MG PO TABS
ORAL_TABLET | ORAL | Status: AC
  Filled 2020-04-06: qty 2

## 2020-04-06 MED ORDER — SODIUM CHLORIDE 0.9% FLUSH: 3.0000 mL | INTRAVENOUS | Status: DC | PRN

## 2020-04-06 MED ORDER — SOD CITRATE-CITRIC ACID 500-334 MG/5ML PO SOLN
30.0000 mL | ORAL | Status: AC
  Administered 2020-04-06: 30 mL via ORAL

## 2020-04-06 MED ORDER — WITCH HAZEL-GLYCERIN EX PADS: 1.0000 "application " | MEDICATED_PAD | CUTANEOUS | Status: DC | PRN

## 2020-04-06 MED ORDER — DEXAMETHASONE SODIUM PHOSPHATE 4 MG/ML IJ SOLN
INTRAMUSCULAR | Status: DC | PRN
  Administered 2020-04-06: 4 mg via INTRAVENOUS

## 2020-04-06 MED ORDER — TETANUS-DIPHTH-ACELL PERTUSSIS 5-2.5-18.5 LF-MCG/0.5 IM SUSY: 0.5000 mL | PREFILLED_SYRINGE | Freq: Once | INTRAMUSCULAR | Status: DC

## 2020-04-06 MED ORDER — MORPHINE SULFATE (PF) 0.5 MG/ML IJ SOLN
INTRAMUSCULAR | Status: DC | PRN
  Administered 2020-04-06: 150 ug via INTRATHECAL

## 2020-04-06 MED ORDER — DIBUCAINE (PERIANAL) 1 % EX OINT: 1.0000 "application " | TOPICAL_OINTMENT | CUTANEOUS | Status: DC | PRN

## 2020-04-06 MED ORDER — NALOXONE HCL 4 MG/10ML IJ SOLN
1.0000 ug/kg/h | INTRAVENOUS | Status: DC | PRN
  Filled 2020-04-06: qty 5

## 2020-04-06 MED ORDER — DEXAMETHASONE SODIUM PHOSPHATE 4 MG/ML IJ SOLN
INTRAMUSCULAR | Status: AC
  Filled 2020-04-06: qty 2

## 2020-04-06 MED ORDER — GABAPENTIN 300 MG PO CAPS
ORAL_CAPSULE | ORAL | Status: AC
  Filled 2020-04-06: qty 1

## 2020-04-06 MED ORDER — MEASLES, MUMPS & RUBELLA VAC IJ SOLR: 0.5000 mL | Freq: Once | INTRAMUSCULAR | Status: DC

## 2020-04-06 MED ORDER — MORPHINE SULFATE (PF) 0.5 MG/ML IJ SOLN
INTRAMUSCULAR | Status: AC
  Filled 2020-04-06: qty 10

## 2020-04-06 MED ORDER — OXYTOCIN-SODIUM CHLORIDE 30-0.9 UT/500ML-% IV SOLN: 2.5000 [IU]/h | INTRAVENOUS | Status: AC

## 2020-04-06 MED ORDER — LACTATED RINGERS IV SOLN: INTRAVENOUS | Status: DC

## 2020-04-06 MED ORDER — FENTANYL CITRATE (PF) 100 MCG/2ML IJ SOLN
INTRAMUSCULAR | Status: DC | PRN
  Administered 2020-04-06: 15 ug via INTRATHECAL

## 2020-04-06 MED ORDER — ONDANSETRON HCL 4 MG/2ML IJ SOLN
INTRAMUSCULAR | Status: DC | PRN
  Administered 2020-04-06: 4 mg via INTRAVENOUS

## 2020-04-06 MED ORDER — GABAPENTIN 300 MG PO CAPS
300.0000 mg | ORAL_CAPSULE | ORAL | Status: AC
  Administered 2020-04-06: 300 mg via ORAL

## 2020-04-06 MED ORDER — SCOPOLAMINE 1 MG/3DAYS TD PT72
MEDICATED_PATCH | TRANSDERMAL | Status: AC
  Filled 2020-04-06: qty 1

## 2020-04-06 MED ORDER — BUPIVACAINE HCL (PF) 0.25 % IJ SOLN
INTRAMUSCULAR | Status: AC
  Filled 2020-04-06: qty 30

## 2020-04-06 MED ORDER — NALBUPHINE HCL 10 MG/ML IJ SOLN
5.0000 mg | INTRAMUSCULAR | Status: DC | PRN
Start: 2020-04-06 — End: 2020-04-08

## 2020-04-06 MED ORDER — ONDANSETRON HCL 4 MG/2ML IJ SOLN
INTRAMUSCULAR | Status: AC
  Filled 2020-04-06: qty 2

## 2020-04-06 MED ORDER — FENTANYL CITRATE (PF) 100 MCG/2ML IJ SOLN
INTRAMUSCULAR | Status: AC
  Filled 2020-04-06: qty 2

## 2020-04-06 MED ORDER — POVIDONE-IODINE 10 % EX SWAB
2.0000 "application " | Freq: Once | CUTANEOUS | Status: AC
  Administered 2020-04-06: 2 via TOPICAL

## 2020-04-06 MED ORDER — CEFAZOLIN SODIUM-DEXTROSE 2-3 GM-%(50ML) IV SOLR
INTRAVENOUS | Status: DC | PRN
  Administered 2020-04-06: 2 g via INTRAVENOUS

## 2020-04-06 MED ORDER — DIPHENHYDRAMINE HCL 50 MG/ML IJ SOLN: 12.5000 mg | Freq: Four times a day (QID) | INTRAMUSCULAR | Status: DC | PRN

## 2020-04-06 MED ORDER — CEFAZOLIN SODIUM-DEXTROSE 2-4 GM/100ML-% IV SOLN
INTRAVENOUS | Status: AC
  Filled 2020-04-06: qty 100

## 2020-04-06 MED ORDER — BUPIVACAINE HCL 0.25 % IJ SOLN
INTRAMUSCULAR | Status: DC | PRN
  Administered 2020-04-06: 30 mL

## 2020-04-06 MED ORDER — PHENYLEPHRINE HCL-NACL 20-0.9 MG/250ML-% IV SOLN
INTRAVENOUS | Status: DC | PRN
  Administered 2020-04-06: 60 ug/min via INTRAVENOUS

## 2020-04-06 MED ORDER — SENNOSIDES-DOCUSATE SODIUM 8.6-50 MG PO TABS
2.0000 | ORAL_TABLET | ORAL | Status: DC
  Administered 2020-04-07: 2 via ORAL
  Filled 2020-04-06: qty 2

## 2020-04-06 MED ORDER — OXYTOCIN-SODIUM CHLORIDE 30-0.9 UT/500ML-% IV SOLN
INTRAVENOUS | Status: AC
  Administered 2020-04-06: 2.5 [IU]/h via INTRAVENOUS
  Filled 2020-04-06: qty 500

## 2020-04-06 MED ORDER — PRENATAL MULTIVITAMIN CH
1.0000 | ORAL_TABLET | Freq: Every day | ORAL | Status: DC
  Administered 2020-04-07 – 2020-04-08 (×2): 1 via ORAL
  Filled 2020-04-06 (×2): qty 1

## 2020-04-06 MED ORDER — SIMETHICONE 80 MG PO CHEW
80.0000 mg | CHEWABLE_TABLET | ORAL | Status: DC | PRN
Start: 2020-04-06 — End: 2020-04-08

## 2020-04-06 MED ORDER — BUPIVACAINE IN DEXTROSE 0.75-8.25 % IT SOLN
INTRATHECAL | Status: DC | PRN
  Administered 2020-04-06: 1.6 mL via INTRATHECAL

## 2020-04-06 MED ORDER — OXYCODONE HCL 5 MG PO TABS: 5.0000 mg | ORAL_TABLET | ORAL | Status: DC | PRN

## 2020-04-06 MED ORDER — PHENYLEPHRINE HCL-NACL 20-0.9 MG/250ML-% IV SOLN
INTRAVENOUS | Status: AC
  Filled 2020-04-06: qty 250

## 2020-04-06 MED ORDER — SOD CITRATE-CITRIC ACID 500-334 MG/5ML PO SOLN
ORAL | Status: AC
  Filled 2020-04-06: qty 30

## 2020-04-06 MED ORDER — ONDANSETRON HCL 4 MG/2ML IJ SOLN: 4.0000 mg | Freq: Three times a day (TID) | INTRAMUSCULAR | Status: DC | PRN

## 2020-04-06 MED ORDER — IBUPROFEN 800 MG PO TABS
800.0000 mg | ORAL_TABLET | Freq: Four times a day (QID) | ORAL | Status: DC
  Administered 2020-04-07 – 2020-04-08 (×5): 800 mg via ORAL
  Filled 2020-04-06 (×5): qty 1

## 2020-04-06 MED ORDER — COCONUT OIL OIL: 1.0000 "application " | TOPICAL_OIL | Status: DC | PRN

## 2020-04-06 MED ORDER — KETOROLAC TROMETHAMINE 30 MG/ML IJ SOLN
30.0000 mg | Freq: Once | INTRAMUSCULAR | Status: AC
  Administered 2020-04-06: 30 mg via INTRAVENOUS

## 2020-04-06 MED ORDER — SIMETHICONE 80 MG PO CHEW
80.0000 mg | CHEWABLE_TABLET | Freq: Three times a day (TID) | ORAL | Status: DC
  Administered 2020-04-06 – 2020-04-08 (×6): 80 mg via ORAL
  Filled 2020-04-06 (×6): qty 1

## 2020-04-06 MED ORDER — ACETAMINOPHEN 500 MG PO TABS
1000.0000 mg | ORAL_TABLET | ORAL | Status: AC
  Administered 2020-04-06: 1000 mg via ORAL

## 2020-04-06 SURGICAL SUPPLY — 30 items
BENZOIN TINCTURE PRP APPL 2/3 (GAUZE/BANDAGES/DRESSINGS) ×2 IMPLANT
CLAMP CORD UMBIL (MISCELLANEOUS) ×2 IMPLANT
CLOTH BEACON ORANGE TIMEOUT ST (SAFETY) ×2 IMPLANT
DRSG OPSITE POSTOP 4X10 (GAUZE/BANDAGES/DRESSINGS) ×2 IMPLANT
ELECT REM PT RETURN 9FT ADLT (ELECTROSURGICAL) ×2
ELECTRODE REM PT RTRN 9FT ADLT (ELECTROSURGICAL) ×1 IMPLANT
EXTRACTOR VACUUM M CUP 4 TUBE (SUCTIONS) IMPLANT
GLOVE BIOGEL PI IND STRL 7.0 (GLOVE) ×3 IMPLANT
GLOVE BIOGEL PI INDICATOR 7.0 (GLOVE) ×3
GLOVE ECLIPSE 7.0 STRL STRAW (GLOVE) ×2 IMPLANT
GOWN STRL REUS W/TWL LRG LVL3 (GOWN DISPOSABLE) ×4 IMPLANT
KIT ABG SYR 3ML LUER SLIP (SYRINGE) ×2 IMPLANT
NEEDLE HYPO 22GX1.5 SAFETY (NEEDLE) ×2 IMPLANT
NEEDLE HYPO 25X5/8 SAFETYGLIDE (NEEDLE) ×2 IMPLANT
NS IRRIG 1000ML POUR BTL (IV SOLUTION) ×2 IMPLANT
PACK C SECTION WH (CUSTOM PROCEDURE TRAY) ×2 IMPLANT
PAD ABD 7.5X8 STRL (GAUZE/BANDAGES/DRESSINGS) ×2 IMPLANT
PAD OB MATERNITY 4.3X12.25 (PERSONAL CARE ITEMS) ×2 IMPLANT
PENCIL SMOKE EVAC W/HOLSTER (ELECTROSURGICAL) ×2 IMPLANT
RTRCTR C-SECT PINK 25CM LRG (MISCELLANEOUS) ×2 IMPLANT
STRIP CLOSURE SKIN 1/2X4 (GAUZE/BANDAGES/DRESSINGS) ×2 IMPLANT
SUT MNCRL 0 VIOLET CTX 36 (SUTURE) ×2 IMPLANT
SUT MONOCRYL 0 CTX 36 (SUTURE) ×2
SUT VIC AB 0 CTX 36 (SUTURE) ×1
SUT VIC AB 0 CTX36XBRD ANBCTRL (SUTURE) ×1 IMPLANT
SUT VIC AB 4-0 KS 27 (SUTURE) ×2 IMPLANT
SYR 30ML LL (SYRINGE) ×2 IMPLANT
TOWEL OR 17X24 6PK STRL BLUE (TOWEL DISPOSABLE) ×2 IMPLANT
TRAY FOLEY W/BAG SLVR 14FR LF (SET/KITS/TRAYS/PACK) ×2 IMPLANT
WATER STERILE IRR 1000ML POUR (IV SOLUTION) ×2 IMPLANT

## 2020-04-06 NOTE — Op Note (Signed)
Preoperative Diagnosis:  IUP @ [redacted]w[redacted]d, Previous C-section x 2, GDM A2  Postoperative Diagnosis:  Same  Procedure: Repeat low transverse cesarean section  Surgeon: Tinnie Gens, M.D.  Assistant: None  Findings: Viable female infant, APGAR and    Weight pending vertex presentation, LOA position  Estimated blood loss: 1000 cc  Complications: None known  Specimens: Placenta to labor and delivery  Reason for procedure: Briefly, the patient is a 25 y.o. [id number] [redacted]w[redacted]d who presents for ERLTCS.  Procedure: Patient is a to the OR where spinal analgesia was administered. She was then placed in a supine position with left lateral tilt. She received 2 g of Ancef and SCDs were in place. A timeout was performed. She was prepped and draped in the usual sterile fashion. A Foley catheter was placed in the bladder. A knife was then used to make a Pfannenstiel incision. This incision was carried out to underlying fascia which was divided in the midline with the knife. The incision was extended laterally, sharply. Due to adhesions, the rectus was dissected off the fascia superiorly with the electrocautery. The rectus was divided in the midline.  The peritoneal cavity was entered bluntly. The incision was extended at the rectus and peritoneum with the electrocautery.  Alexis retractor was placed inside the incision. A bladder flap created due to it being high on the uterus. A knife was used to make a low transverse incision on the uterus. This incision was carried down to the amniotic cavity was entered. Fetus was in LOA position and was brought up out of the incision without difficulty. Delayed cord clamping x 1 minute. Spontaneous cry heard. Cord was clamped x 2 and cut. Infant taken to waiting nurse.  Cord blood was obtained. Placenta was delivered from the uterus.  Uterus was cleaned with dry lap pads. Uterine incision closed with 0 Monocryl suture in a locked running fashion.  Alexis retractor was removed from the  abdomen. Peritoneal closure was done with 0 Monocryl suture.  Fascia is closed with 0 Vicryl suture in a running fashion. Subcutaneous tissue infused with 30cc 0.25% Marcaine.  Subcutaneous closure was performed with 0 plain suture.  Skin closed using 3-0 Vicryl on a Keith needle.  Steri strips applied, followed by Honeycomb and pressure dressing.  All instrument, needle and lap counts were correct x 2. Patient was awake and taken to PACU stable. Infant remained with mom in couplet care, stable.   Shelbie Proctor PrattMD 04/06/2020 10:52 AM

## 2020-04-06 NOTE — Lactation Note (Signed)
This note was copied from a baby's chart. Lactation Consultation Note  Patient Name: Shelley Wiggins [id number] Date: 04/06/2020 Reason for consult: Initial assessment;Other (Comment);Term;Maternal endocrine disorder (COVID (+)) Age:25 hours  Visited with mom of 2 hours old FT female, she had a C/S and has been able to take baby to the breast in the PACU already. Mom is a P3 and experienced BF, she BF her first child for almost 2 years and her second one for 1 1/2 months, per mom baby would not latch and her milk dried out. However, this baby was able to latch immediately right after birth, mom is COVID (+) and plans on doing both breast and formula feeding.  She participated in the [facility] program at the [facility] and she's already familiar with hand expression, mom did teach back to [facility] and she was able to easily get big drops of colostrum. Noticed that her nipples are everted but she has an "innie" on her left breast, tissue is very compressible though. Mom had GDM during the pregnancy, she was on Metformin, first glucose reading for baby is 57 and WNL.  Offered assistance with latch but mom politely declined, she told LC baby already fed and she didn't want to wake her up. Asked mom to call for assistance with needed. Reviewed normal newborn behavior, feeding cues, cluster feeding, size of baby's stomach and breastmilk storage guidelines. Mom doesn't have a pump at home, [facility] issued a hand pump for home use.   Feeding plan:  1. Encouraged mom to feed baby STS 8-12 times/24 hours or sooner if feeding cues are present 2. Hand expression and spoon feeding were also encouraged 3. Mom plans to supplement with formula, she asked for slow flow nipples to use with her Medela hand pump bottle as well.   BF brochure (SP), BF resources (SP) and feeding diary (SP) were reviewed. No support person in mom's room at the time of [facility] consultation. Mom reported all questions and concerns were answered, she's aware of  LC OP services and will call PRN.   Maternal Data Formula Feeding for Exclusion: No Has patient been taught Hand Expression?: Yes Does the patient have breastfeeding experience prior to this delivery?: Yes  Feeding Feeding Type: Breast Fed  LATCH Score Latch: Grasps breast easily, tongue down, lips flanged, rhythmical sucking.  Audible Swallowing: A few with stimulation  Type of Nipple: Everted at rest and after stimulation  Comfort (Breast/Nipple): Soft / non-tender  Hold (Positioning): Full assist, staff holds infant at breast  LATCH Score: 7  Interventions Interventions: Breast feeding basics reviewed;Hand express;Breast massage;Breast compression  Lactation Tools Discussed/Used WIC Program: Yes   Consult Status Consult Status: Follow-up Date: 04/07/20 Follow-up type: In-patient    Bennett Vanscyoc Venetia Constable 04/06/2020, 1:38 PM

## 2020-04-06 NOTE — Anesthesia Procedure Notes (Signed)
Spinal  Patient location during procedure: OR Start time: 04/06/2020 9:35 AM End time: 04/06/2020 9:40 AM Staffing Performed: anesthesiologist  Anesthesiologist: Leonides Grills, MD Preanesthetic Checklist Completed: patient identified, IV checked, risks and benefits discussed, surgical consent, monitors and equipment checked, pre-op evaluation and timeout performed Spinal Block Patient position: sitting Prep: DuraPrep Patient monitoring: cardiac monitor, continuous pulse ox and blood pressure Approach: midline Location: L4-5 Injection technique: single-shot Needle Needle type: Pencan  Needle gauge: 24 G Needle length: 9 cm Assessment Sensory level: T10 Additional Notes Functioning IV was confirmed and monitors were applied. Sterile prep and drape, including hand hygiene and sterile gloves were used. The patient was positioned and the spine was prepped. The skin was anesthetized with lidocaine.  Free flow of clear CSF was obtained prior to injecting local anesthetic into the CSF.  The spinal needle aspirated freely following injection.  The needle was carefully withdrawn.  The patient tolerated the procedure well.

## 2020-04-06 NOTE — Transfer of Care (Signed)
Immediate Anesthesia Transfer of Care Note  Patient: Shelley Wiggins  Procedure(s) Performed: CESAREAN SECTION (N/A )  Patient Location: PACU  Anesthesia Type:Spinal  Level of Consciousness: awake, alert  and oriented  Airway & Oxygen Therapy: Patient Spontanous Breathing  Post-op Assessment: Report given to RN and Post -op Vital signs reviewed and stable  Post vital signs: Reviewed and stable  Last Vitals:  Vitals Value Taken Time  BP 107/68 04/06/20 1102  Temp    Pulse 73 04/06/20 1104  Resp 18 04/06/20 1104  SpO2 99 % 04/06/20 1104  Vitals shown include unvalidated device data.  Last Pain:  Vitals:   04/06/20 0725  TempSrc: Oral  PainSc: 4          Complications: No complications documented.

## 2020-04-06 NOTE — Interval H&P Note (Signed)
History and Physical Interval Note:  04/06/2020 9:20 AM  Shelley Wiggins  has presented today for surgery, with the diagnosis of RCS.  The various methods of treatment have been discussed with the patient and family. After consideration of risks, benefits and other options for treatment, the patient has consented to  Procedure(s): CESAREAN SECTION (N/A) as a surgical intervention.  The patient's history has been reviewed, patient examined, no change in status, stable for surgery.  I have reviewed the patient's chart and labs.  Questions were answered to the patient's satisfaction.     Reva Bores

## 2020-04-06 NOTE — Anesthesia Postprocedure Evaluation (Signed)
Anesthesia Post Note  Patient: Shelley Wiggins  Procedure(s) Performed: CESAREAN SECTION (N/A )     Patient location during evaluation: PACU Anesthesia Type: Spinal Level of consciousness: oriented and awake and alert Pain management: pain level controlled Vital Signs Assessment: post-procedure vital signs reviewed and stable Respiratory status: spontaneous breathing, respiratory function stable and patient connected to nasal cannula oxygen Cardiovascular status: blood pressure returned to baseline and stable Postop Assessment: no headache, no backache, no apparent nausea or vomiting and spinal receding Anesthetic complications: no   No complications documented.  Last Vitals:  Vitals:   04/06/20 1430 04/06/20 1539  BP:    Pulse:    Resp: 18 18  Temp:    SpO2: 98% 97%    Last Pain:  Vitals:   04/06/20 1539  TempSrc:   PainSc: 0-No pain   Pain Goal:                Epidural/Spinal Function Cutaneous sensation: Tingles (04/06/20 1539), Patient able to flex knees: Yes (04/06/20 1539), Patient able to lift hips off bed: Yes (04/06/20 1539), Back pain beyond tenderness at insertion site: No (04/06/20 1539), Progressively worsening motor and/or sensory loss: No (04/06/20 1539), Bowel and/or bladder incontinence post epidural: No (04/06/20 1539)  Ismar Yabut P Lemond Griffee

## 2020-04-06 NOTE — Anesthesia Preprocedure Evaluation (Addendum)
Anesthesia Evaluation  Patient identified by MRN, date of birth, ID band Patient awake    Reviewed: Allergy & Precautions, NPO status , Patient's Chart, lab work & pertinent test results  Airway Mallampati: III  TM Distance: >3 FB Neck ROM: Full    Dental no notable dental hx.    Pulmonary neg pulmonary ROS,    Pulmonary exam normal breath sounds clear to auscultation       Cardiovascular negative cardio ROS Normal cardiovascular exam Rhythm:Regular Rate:Normal     Neuro/Psych negative neurological ROS  negative psych ROS   GI/Hepatic negative GI ROS, Neg liver ROS,   Endo/Other  diabetes, Gestational  Renal/GU negative Renal ROS     Musculoskeletal negative musculoskeletal ROS (+)   Abdominal (+) + obese,   Peds  Hematology negative hematology ROS (+)   Anesthesia Other Findings Repeat C-S Coronavirus POSITIVE     Reproductive/Obstetrics (+) Pregnancy                           Anesthesia Physical Anesthesia Plan  ASA: II  Anesthesia Plan: Spinal   Post-op Pain Management:    Induction:   PONV Risk Score and Plan: 2 and Ondansetron, Dexamethasone and Treatment may vary due to age or medical condition  Airway Management Planned: Natural Airway  Additional Equipment:   Intra-op Plan:   Post-operative Plan:   Informed Consent: I have reviewed the patients History and Physical, chart, labs and discussed the procedure including the risks, benefits and alternatives for the proposed anesthesia with the patient or authorized representative who has indicated his/her understanding and acceptance.     Interpreter used for [employer] Discussed with: CRNA  Anesthesia Plan Comments:        Anesthesia Quick Evaluation

## 2020-04-06 NOTE — Discharge Summary (Signed)
Postpartum Discharge Summary    Patient Name: Shelley Wiggins DOB: 12/22/1995 MRN: [id number]  Date of admission: 04/06/2020 Delivery date:04/06/2020  Delivering provider: Donnamae Jude  Date of discharge: 04/08/2020  Admitting diagnosis: Previous cesarean section complicating pregnancy, antepartum condition or complication [G25.427] Intrauterine pregnancy: [redacted]w[redacted]d    Secondary diagnosis:  Principal Problem:   H/O cesarean section Active Problems:   Language barrier   Gestational diabetes mellitus (GDM), antepartum   LGA (large for gestational age) fetus affecting management of mother   Previous cesarean section complicating pregnancy, antepartum condition or complication   [id number] pregnancy in third trimester  Additional problems: none    Discharge diagnosis: Term Pregnancy Delivered and GDM A2                                              Post partum procedures:depo Augmentation: N/A Complications: None  Hospital course: Sceduled C/S   25y.o. yo G3P2002 at 25w0das admitted to the hospital 04/06/2020 for scheduled cesarean section with the following indication:Elective Repeat.Delivery details are as follows:  Membrane Rupture Time/Date: 10:10 AM ,04/06/2020   Delivery Method:C-Section, Low Transverse  Details of operation can be found in separate operative note.  Patient had an uncomplicated postpartum course.  She is ambulating, tolerating a regular diet, passing flatus, and urinating well. Patient is discharged home in stable condition on  04/08/20        Newborn Data: Birth date:04/06/2020  Birth time:10:11 AM  Gender:Female  Living status:Living  Apgars:9 ,10  Weight:3901 g     Magnesium Sulfate received: No BMZ received: No Rhophylac:N/A MMR:N/A T-DaP:Given prenatally Flu: Yes Transfusion:No  Physical exam  Vitals:   04/07/20 0031 04/07/20 0600 04/07/20 2001 04/08/20 0610  BP: (!) 108/50 (!) 108/54 122/70 117/89  Pulse: 80 70 77 76   Resp: '18 18 18 18  ' Temp: 98.5 F (36.9 C) 98.1 F (36.7 C) 98 F (36.7 C) 98.2 F (36.8 C)  TempSrc: Oral Oral Oral Other (Comment)  SpO2: 98% 99% 99% 99%   General: alert, cooperative and no distress Lochia: appropriate Uterine Fundus: firm Incision: Healing well with no significant drainage, No significant erythema, Dressing is clean, dry, and intact DVT Evaluation: No evidence of DVT seen on physical exam. No cords or calf tenderness. No significant calf/ankle edema. Labs: Lab Results  Component Value Date   WBC 16.2 (H) 04/07/2020   HGB 10.3 (L) 04/07/2020   HCT 30.9 (L) 04/07/2020   MCV 80.5 04/07/2020   PLT 220 04/07/2020   CMP Latest Ref Rng & Units 03/06/2020  Glucose 65 - 99 mg/dL 96  BUN 6 - 20 mg/dL 7  Creatinine 0.57 - 1.00 mg/dL 0.53(L)  Sodium 134 - 144 mmol/L 136  Potassium 3.5 - 5.2 mmol/L 4.3  Chloride 96 - 106 mmol/L 102  CO2 20 - 29 mmol/L 16(L)  Calcium 8.7 - 10.2 mg/dL 9.5  Total Protein 6.0 - 8.5 g/dL 7.0  Total Bilirubin 0.0 - 1.2 mg/dL 0.2  Alkaline Phos 44 - 121 IU/L 174(H)  AST 0 - 40 IU/L 9  ALT 0 - 32 IU/L 15   Edinburgh Score: Edinburgh Postnatal Depression Scale Screening Tool 04/06/2020  I have been able to laugh and see the funny side of things. 0  I have looked forward with enjoyment to things. 1  I have blamed myself  unnecessarily when things went wrong. 2  I have been anxious or worried for no good reason. 2  I have felt scared or panicky for no good reason. 0  Things have been getting on top of me. 2  I have been so unhappy that I have had difficulty sleeping. 0  I have felt sad or miserable. 1  I have been so unhappy that I have been crying. 1  The thought of harming myself has occurred to me. 0  Edinburgh Postnatal Depression Scale Total 9     After visit meds:  Allergies as of 04/08/2020   No Known Allergies     Medication List    STOP taking these medications   metFORMIN 500 MG tablet Commonly known as:  Glucophage     TAKE these medications   acetaminophen 325 MG tablet Commonly known as: TYLENOL Take 2 tablets (650 mg total) by mouth every 6 (six) hours as needed for mild pain, moderate pain, fever or headache (temperature > 101.5.). What changed:   medication strength  how much to take  reasons to take this   coconut oil Oil Apply 1 application topically as needed (nipple pain).   ferrous sulfate 325 (65 FE) MG tablet Take 1 tablet (325 mg total) by mouth every other day. Start taking on: April 09, 2020   ibuprofen 800 MG tablet Commonly known as: ADVIL Take 1 tablet (800 mg total) by mouth every 8 (eight) hours as needed for moderate pain or cramping.   oxyCODONE 5 MG immediate release tablet Commonly known as: Oxy IR/ROXICODONE Take 1-2 tablets (5-10 mg total) by mouth every 6 (six) hours as needed for severe pain or breakthrough pain.   prenatal vitamin w/FE, FA 27-1 MG Tabs tablet Take 1 tablet by mouth daily at 12 noon.        Discharge home in stable condition Infant Feeding: Breast Infant Disposition:home with mother Discharge instruction: per After Visit Summary and Postpartum booklet. Activity: Advance as tolerated. Pelvic rest for 6 weeks.  Diet: routine diet Future Appointments: Future Appointments  Date Time Provider [place]  04/29/2020  2:35 PM Herby Abraham [facility]   Follow up Visit: Message sent to [facility] 04/06/20 by Sylvester Harder.  Please schedule this patient for a In person postpartum visit in 4 weeks with the following provider: Any provider. Additional Postpartum F/U:2 hour GTT and Incision check 1 week  High risk pregnancy complicated by: GDM Delivery mode:  C-Section, Low Transverse  Anticipated Birth Control:  Depo  Randa Ngo, MD OB Fellow, Faculty Practice 04/08/2020 8:28 AM

## 2020-04-06 NOTE — Progress Notes (Addendum)
cbg 86 @ 1110

## 2020-04-06 NOTE — Discharge Instructions (Signed)

## 2020-04-07 ENCOUNTER — Ambulatory Visit: Payer: Self-pay

## 2020-04-07 DIAGNOSIS — O9903 Anemia complicating the puerperium: Secondary | ICD-10-CM

## 2020-04-07 DIAGNOSIS — Z98891 History of uterine scar from previous surgery: Secondary | ICD-10-CM

## 2020-04-07 DIAGNOSIS — D649 Anemia, unspecified: Secondary | ICD-10-CM

## 2020-04-07 LAB — CBC
HCT: 30.9 % — ABNORMAL LOW (ref 36.0–46.0)
Hemoglobin: 10.3 g/dL — ABNORMAL LOW (ref 12.0–15.0)
MCH: 26.8 pg (ref 26.0–34.0)
MCHC: 33.3 g/dL (ref 30.0–36.0)
MCV: 80.5 fL (ref 80.0–100.0)
Platelets: 220 10*3/uL (ref 150–400)
RBC: 3.84 MIL/uL — ABNORMAL LOW (ref 3.87–5.11)
RDW: 15.7 % — ABNORMAL HIGH (ref 11.5–15.5)
WBC: 16.2 10*3/uL — ABNORMAL HIGH (ref 4.0–10.5)
nRBC: 0 % (ref 0.0–0.2)

## 2020-04-07 LAB — GLUCOSE, CAPILLARY: Glucose-Capillary: 86 mg/dL (ref 70–99)

## 2020-04-07 MED ORDER — FERROUS SULFATE 325 (65 FE) MG PO TABS
325.0000 mg | ORAL_TABLET | ORAL | Status: DC
  Administered 2020-04-07: 325 mg via ORAL
  Filled 2020-04-07: qty 1

## 2020-04-07 NOTE — Progress Notes (Signed)
POSTPARTUM PROGRESS NOTE  Subjective: Shelley Wiggins is a 25 y.o. [id number] s/p rLTCS at [redacted]w[redacted]d.  She reports she doing well. No acute events overnight. She denies any problems with ambulating, voiding or po intake. Denies nausea or vomiting. She has  passed flatus. Pain is well controlled.  Lochia is minimal.  Objective: Blood pressure (!) 108/54, pulse 70, temperature 98.1 F (36.7 C), temperature source Oral, resp. rate 18, last menstrual period 07/30/2019, SpO2 99 %, unknown if currently breastfeeding.  Physical Exam:  General: alert, cooperative and no distress Chest: no respiratory distress Abdomen: soft, non-tender  Uterine Fundus: firm and at level of umbilicus Extremities: No calf swelling or tenderness  trace LE edema bilaterally  Recent Labs    04/06/20 0741 04/07/20 0441  HGB 12.8 10.3*  HCT 39.6 30.9*    Assessment/Plan: Shelley Wiggins is a 25 y.o. [id number] s/p rLTCS at [redacted]w[redacted]d for h/o CS x2.  Routine Postpartum Care: Doing well, pain well-controlled.  -- Continue routine care, lactation support  -- Contraception: plan for depo prior to discharge -- Feeding: breast feeding well -- Anemia: Hgb 12.8 > 10.3. Start po iron every other day. -- A2GDM: FBG 86 this AM. Will plan for 2h gtt in 6-8 weeks postpartum.  Dispo: Plan for discharge POD#2-3.  Sheila Oats, MD OB Fellow, Faculty Practice 04/07/2020 9:48 AM
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
## 2020-04-07 NOTE — Lactation Note (Signed)
This note was copied from a baby's chart. Lactation Consultation Note  Patient Name: Shelley Wiggins Date: 04/07/2020 Reason for consult: Follow-up assessment Age:25 hours   LC Follow Up Visit:  Per RN, mother does not require a lactation visit at this time.  RN to call if mother would like to visit with me in the future.   Maternal Data    Feeding Feeding Type: Bottle Fed - Formula Nipple Type: Slow - flow  LATCH Score                   Interventions    Lactation Tools Discussed/Used     Consult Status Consult Status: Follow-up Date: 04/08/20 Follow-up type: In-patient    Keeshia Sanderlin R Ariela Mochizuki 04/07/2020, 11:08 AM

## 2020-04-08 DIAGNOSIS — O9853 Other viral diseases complicating the puerperium: Secondary | ICD-10-CM

## 2020-04-08 DIAGNOSIS — Z98891 History of uterine scar from previous surgery: Secondary | ICD-10-CM

## 2020-04-08 DIAGNOSIS — Z8759 Personal history of other complications of pregnancy, childbirth and the puerperium: Secondary | ICD-10-CM

## 2020-04-08 DIAGNOSIS — O24439 Gestational diabetes mellitus in the puerperium, unspecified control: Secondary | ICD-10-CM

## 2020-04-08 DIAGNOSIS — U071 COVID-19: Secondary | ICD-10-CM | POA: Diagnosis not present

## 2020-04-08 DIAGNOSIS — O98513 Other viral diseases complicating pregnancy, third trimester: Secondary | ICD-10-CM

## 2020-04-08 DIAGNOSIS — Z603 Acculturation difficulty: Secondary | ICD-10-CM

## 2020-04-08 MED ORDER — ACETAMINOPHEN 325 MG PO TABS: 650.0000 mg | ORAL_TABLET | Freq: Four times a day (QID) | ORAL | Status: AC | PRN

## 2020-04-08 MED ORDER — IBUPROFEN 800 MG PO TABS: 800.0000 mg | ORAL_TABLET | Freq: Three times a day (TID) | ORAL | 0 refills | Status: AC | PRN

## 2020-04-08 MED ORDER — COCONUT OIL OIL
1.0000 | TOPICAL_OIL | 0 refills | Status: AC | PRN
Start: 2020-04-08 — End: ?

## 2020-04-08 MED ORDER — OXYCODONE HCL 5 MG PO TABS: 5.0000 mg | ORAL_TABLET | Freq: Four times a day (QID) | ORAL | 0 refills | Status: AC | PRN

## 2020-04-08 MED ORDER — FERROUS SULFATE 325 (65 FE) MG PO TABS: 325.0000 mg | ORAL_TABLET | ORAL | 1 refills | Status: AC

## 2020-04-17 ENCOUNTER — Ambulatory Visit (INDEPENDENT_AMBULATORY_CARE_PROVIDER_SITE_OTHER): Payer: Medicaid Other

## 2020-04-17 ENCOUNTER — Other Ambulatory Visit: Payer: Self-pay

## 2020-04-17 ENCOUNTER — Telehealth: Payer: Self-pay | Admitting: *Deleted

## 2020-04-17 ENCOUNTER — Ambulatory Visit: Payer: Medicaid Other

## 2020-04-17 VITALS — BP 119/68 | HR 90 | Wt 230.9 lb

## 2020-04-17 DIAGNOSIS — Z5189 Encounter for other specified aftercare: Secondary | ICD-10-CM | POA: Diagnosis not present

## 2020-04-17 NOTE — Progress Notes (Signed)
Video Interpreter # [id number] Pt here today for incision check from concern of opening in incision reported from family connect nurse.  Incision well approximated- no odor, no drainage, no edema, and no erythema.  Small area observed, 26mm, appears to be healing wound.  Pt advised to continue to allow warm, soapy water run over the incision and pat dry.  I encouraged pt to call the office with any concerns otherwise we will f/u with her at her appt on 05/06/20.  Pt verbalized understanding.    Addison Naegeli, RN  04/17/20

## 2020-04-17 NOTE — Telephone Encounter (Addendum)
Received call from Rico Sheehan, [facility] RN. She stated that she was speaking with pt on the phone and she stated that her C/S incision is open a small amount. Per chart review, pt had C/S on 1/25 and was not scheduled for incision check in the office. I spoke to pt via phone with assistance from interpreter provided by Rico Sheehan and Johnny Bridge remained on the call as well. Pt stated there was a small amount of blood the other day however today there is no drainage or foul odor from the incision. She does endorse some tenderness near the opening. I advised that we need to see her in the office today. Pt stated that she will try to arrange child care. She agreed to appt today @ 3pm. Johnny Bridge then stated that pt had tested positive for Covid on 1/20 and she is having no symptoms.

## 2020-04-17 NOTE — Progress Notes (Signed)
Chart reviewed for nurse visit. Agree with plan of care.   Jawan Chavarria Lorraine, CNM 04/17/2020 4:58 PM   

## 2020-04-29 ENCOUNTER — Ambulatory Visit: Payer: Medicaid Other | Admitting: Certified Nurse Midwife

## 2020-05-05 ENCOUNTER — Other Ambulatory Visit: Payer: Self-pay | Admitting: *Deleted

## 2020-05-05 DIAGNOSIS — Z8632 Personal history of gestational diabetes: Secondary | ICD-10-CM

## 2020-05-06 ENCOUNTER — Other Ambulatory Visit: Payer: Medicaid Other

## 2020-05-06 ENCOUNTER — Encounter: Payer: Self-pay | Admitting: Family Medicine

## 2020-05-06 ENCOUNTER — Ambulatory Visit: Payer: Medicaid Other | Admitting: Family Medicine

## 2020-07-11 IMAGING — US US MFM FETAL BPP W/O NON-STRESS
1 series · 13 of 28 positions shown · non-contrast
Comparison: none

[Series 1: us mfm fetal bpp w/o non-stress · 88 acquisitions, 13 frames shown]
[im 4/88]
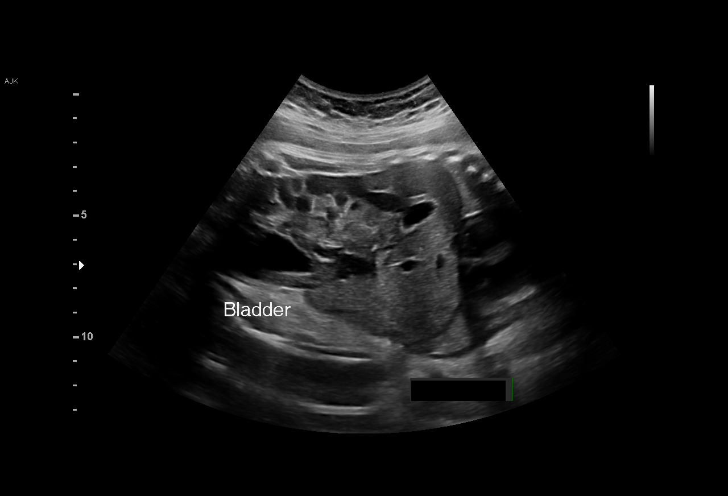
[im 10/88]
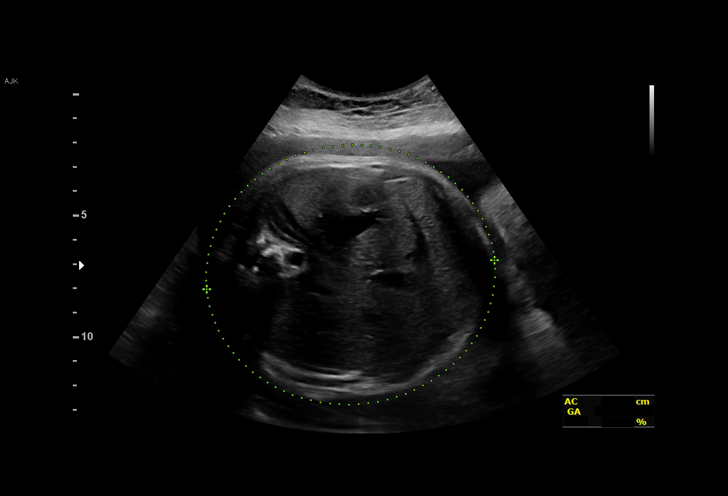
[im 17/88]
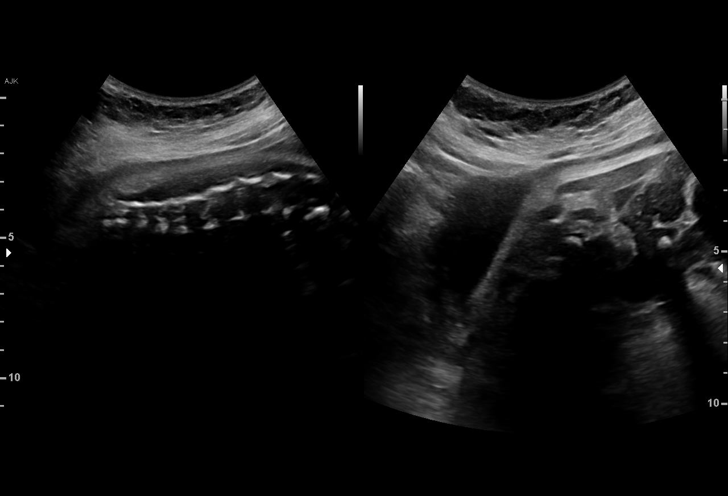
[im 23/88]
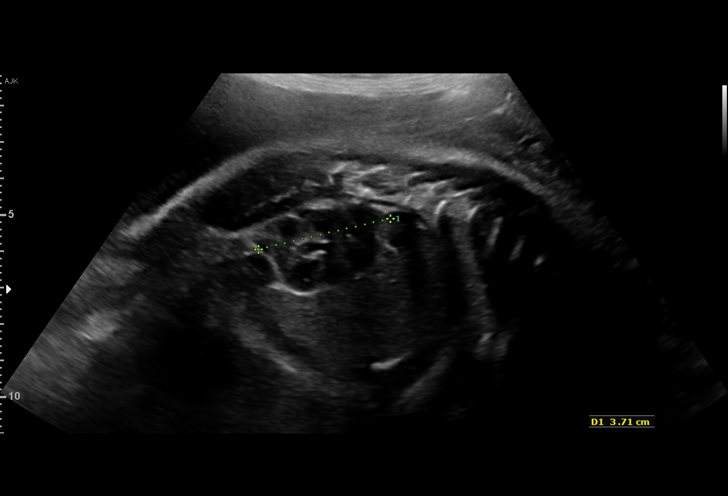
[im 30/88]
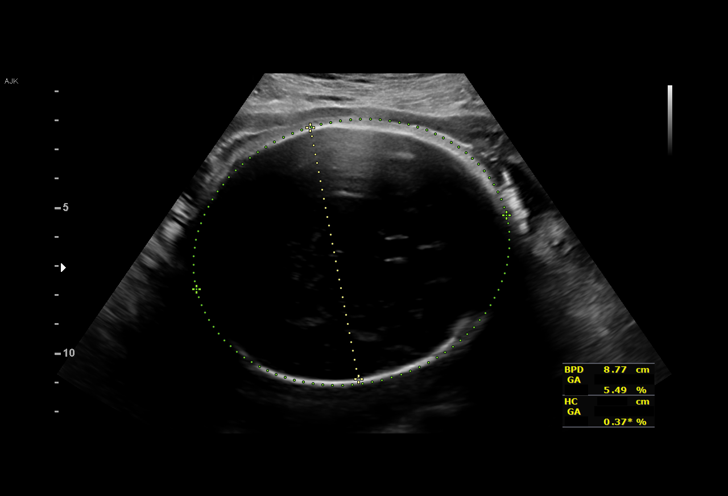
[im 36/88]
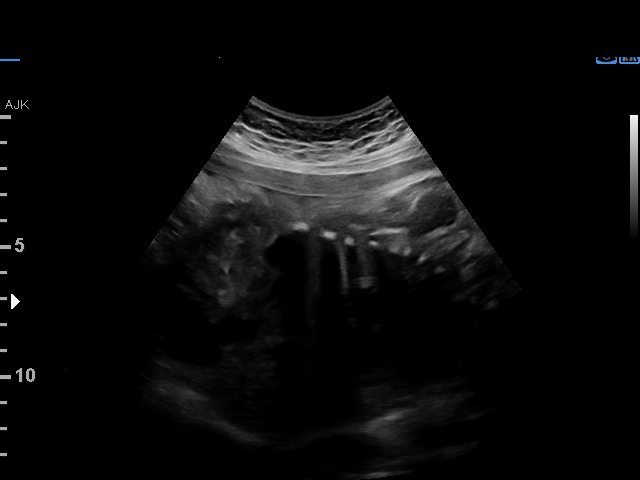
[im 46/88]
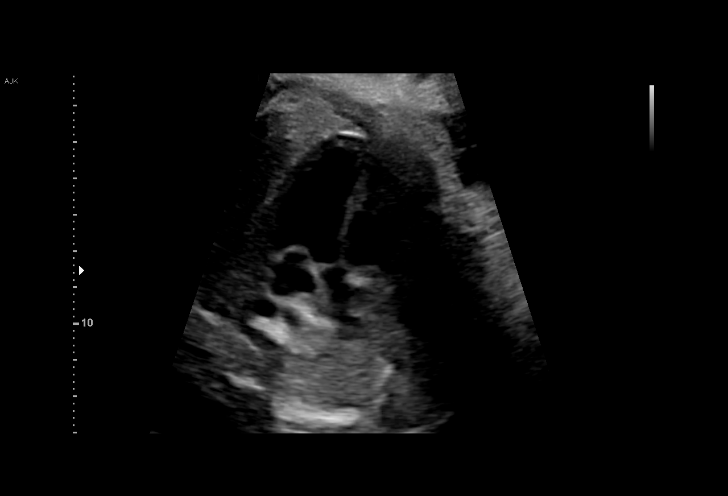
[im 52/88]
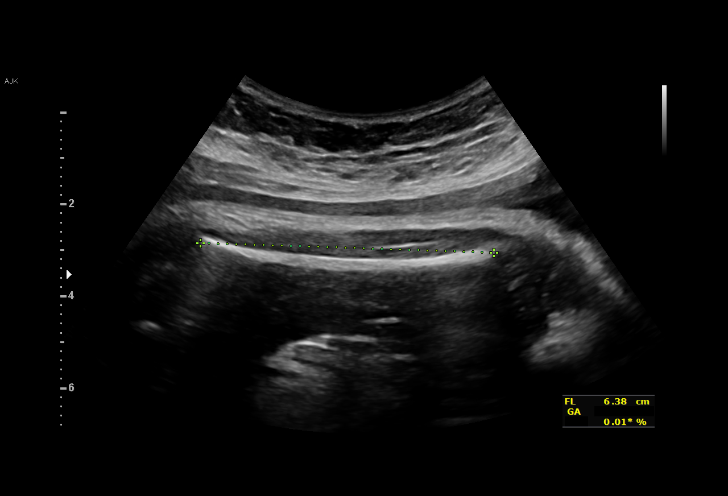
[im 59/88]
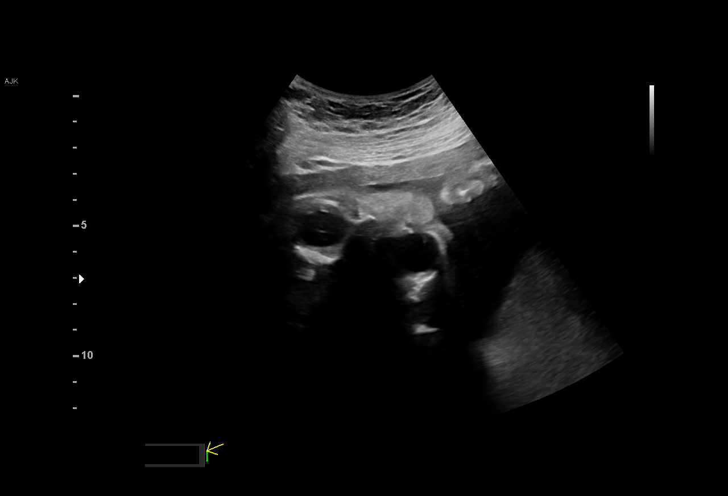
[im 65/88]
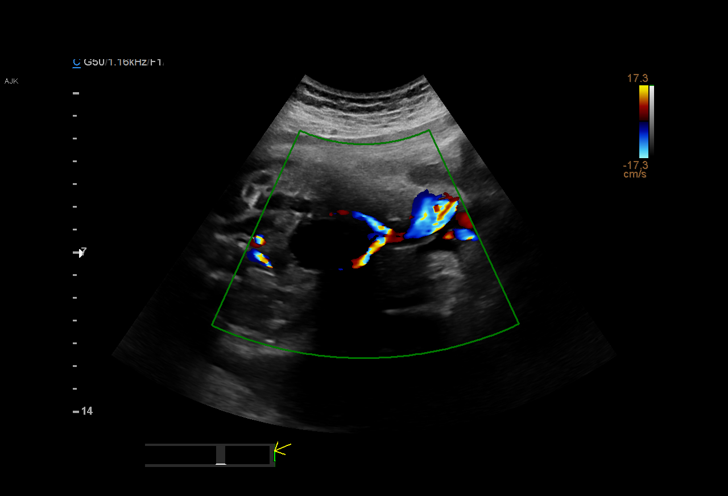
[im 71/88]
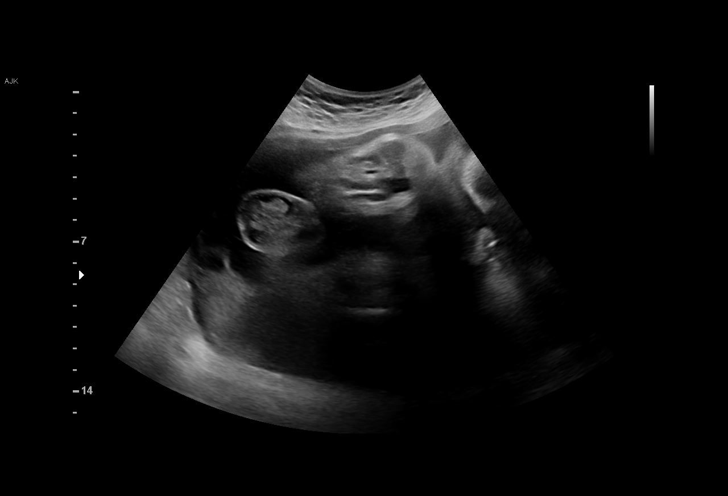
[im 78/88]
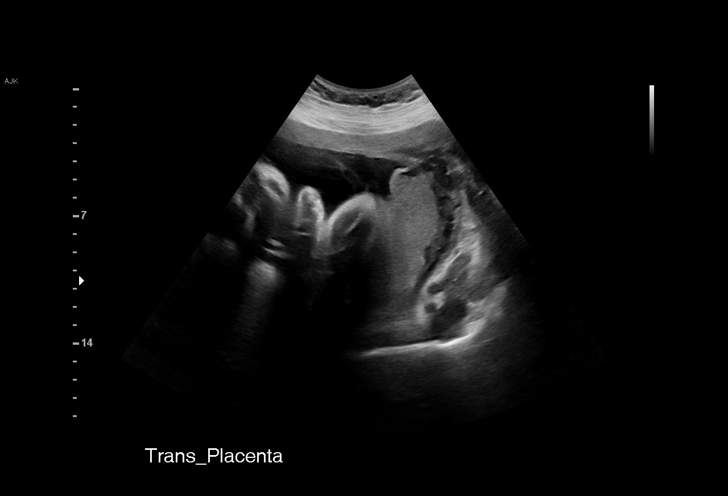
[im 84/88]
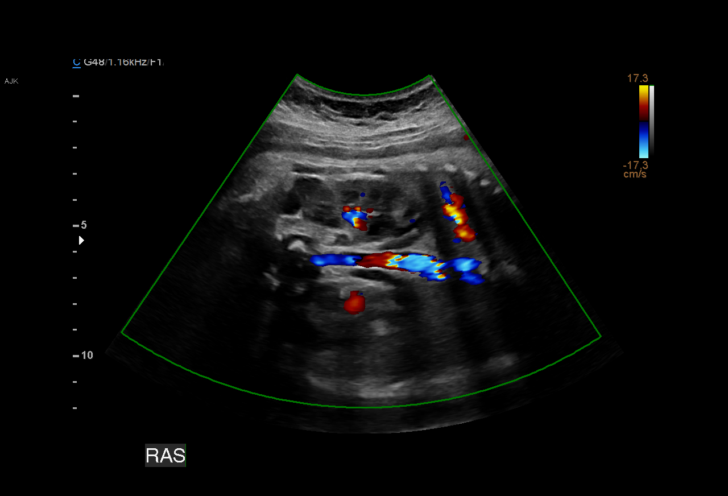

[13 of 28 positions shown; findings below may reference images not displayed]

IVIE CNM

 ----------------------------------------------------------------------

 ----------------------------------------------------------------------
Indications

  Decreased fetal movements, third trimester,
  unspecified
  Insufficient Prenatal Care (no PNC)
  Encounter for antenatal screening for
  malformations
  Previous cesarean delivery, antepartum
  38 weeks gestation of pregnancy
 ----------------------------------------------------------------------
Fetal Evaluation

 Num Of Fetuses:         1
 Fetal Heart Rate(bpm):  137
 Cardiac Activity:       Observed
 Presentation:           Cephalic
 Placenta:               Posterior
 P. Cord Insertion:      Visualized

 Amniotic Fluid
 AFI FV:      Within normal limits

 AFI Sum(cm)     %Tile       Largest Pocket(cm)
 14.61           58

 RUQ(cm)       RLQ(cm)       LUQ(cm)        LLQ(cm)

Biophysical Evaluation
 Amniotic F.V:   Within normal limits       F. Tone:        Observed
 F. Movement:    Observed                   Score:          [DATE]
 F. Breathing:   Observed
Biometry

 BPD:        88  mm     G. Age:  35w 4d          7  %    CI:        76.61   %    70 - 86
                                                         FL/HC:      20.1   %    20.6 -
 HC:      318.5  mm     G. Age:  35w 6d        < 3  %    HC/AC:      0.91        0.87 -
 AC:      349.1  mm     G. Age:  38w 6d         73  %    FL/BPD:     72.6   %    71 - 87
 FL:       63.9  mm     G. Age:  33w 0d        < 3  %    FL/AC:      18.3   %    20 - 24
 HUM:      55.7  mm     G. Age:  32w 3d        < 5  %
 CER:      48.2  mm     G. Age:  N/A            77  %

 LV:        4.1  mm

  RIGHT
 HUM:      55.7  mm     G. Age:  32w 3d        < 5  %
 TIB:      55.1  mm     G. Age:  32w 3d        < 5  %
 FIB:      57.2  mm     G. Age:  35w 1d         42  %
  LEFT
 TIB:      55.1  mm     G. Age:  32w 3d        < 5  %
 FIB:      57.2  mm     G. Age:  35w 1d         42  %
 Est. FW:    1311  gm    6 lb 11 oz      41  %
OB History

 Gravidity:    2         Term:   1
Gestational Age

 LMP:           38w 5d        Date:  04/29/17                 EDD:   02/03/18
 U/S Today:     35w 6d                                        EDD:   02/23/18
 Best:          38w 5d     Det. By:  LMP  (04/29/17)          EDD:   02/03/18
Anatomy

 Cranium:               Appears normal         Aortic Arch:            Not well visualized
 Cavum:                 Appears normal         Ductal Arch:            Not well visualized
 Ventricles:            Appears normal         Diaphragm:              Appears normal
 Choroid Plexus:        Appears normal         Stomach:                Appears normal, left
                                                                       sided
 Cerebellum:            Appears normal         Abdomen:                Appears normal
 Posterior Fossa:       Not well visualized    Abdominal Wall:         Not well visualized
 Nuchal Fold:           Not applicable (>20    Cord Vessels:           Appears normal (3
                        wks GA)                                        vessel cord)
 Face:                  Appears normal         Kidneys:                Appear normal
                        (orbits and profile)
 Lips:                  Appears normal         Bladder:                Appears normal
 Thoracic:              Appears normal         Spine:                  Appears normal
 Heart:                 Appears normal         Upper Extremities:      LT WNL; RT NWS
                        (4CH, axis, and
                        situs)
 RVOT:                  Appears normal         Lower Extremities:      Appears normal
 LVOT:                  Appears normal

 Other:  Fetus appears to be a male. LT heel visualize. Technically difficult
         due to advanced GA and fetal position.
Comments

 Comments:
 U/S images reviewed  No fetal abnormalities are seen.  No
 evidence of fetal compromise is found on today's U/S.
 Appropriate growth is noted.  HC, FL, humeral length, and
 tibial length are all decreased.
 Recommendations: 1) Follow-up as clinically indicated

Recommendations

  Follow-up as clinically indicated

                   Rauda, Osuna

## 2020-10-07 ENCOUNTER — Telehealth: Payer: Self-pay

## 2020-10-07 NOTE — Telephone Encounter (Signed)
   Shelley Wiggins DOB: Nov 15, 1995 MRN: [id number]   RIDER WAIVER AND RELEASE OF LIABILITY  For purposes of improving physical access to our facilities, [facility] is pleased to partner with third parties to provide [facility] patients or other authorized individuals the option of convenient, on-demand ground transportation services (the [employer]") through use of the technology service that enables users to request on-demand ground transportation from independent third-party providers.  By opting to use and accept these [employer], I, the undersigned, hereby agree on behalf of myself, and on behalf of any minor child using the Science writer for whom I am the parent or legal guardian, as follows:  Science writer provided to me are provided by independent third-party transportation providers who are not [employer] or employees and who are unaffiliated with [employer]. [facility] is neither a transportation carrier nor a common or public carrier. [facility] has no control over the quality or safety of the transportation that occurs as a result of the [employer]. [facility] cannot guarantee that any third-party transportation provider will complete any arranged transportation service. [facility] makes no representation, warranty, or guarantee regarding the reliability, timeliness, quality, safety, suitability, or availability of any of the Transport Services or that they will be error free. I fully understand that traveling by vehicle involves risks and dangers of serious bodily injury, including permanent disability, paralysis, and death. I agree, on behalf of myself and on behalf of any minor child using the Transport Services for whom I am the parent or legal guardian, that the entire risk arising out of my use of the [employer] remains solely with me, to the maximum extent permitted under applicable law. The [employer]  are provided "as is" and "as available." [facility] disclaims all representations and warranties, express, implied or statutory, not expressly set out in these terms, including the implied warranties of merchantability and fitness for a particular purpose. I hereby waive and release [facility], its agents, employees, officers, directors, representatives, insurers, attorneys, assigns, successors, subsidiaries, and affiliates from any and all past, present, or future claims, demands, liabilities, actions, causes of action, or suits of any kind directly or indirectly arising from acceptance and use of the [employer]. I further waive and release [facility] and its affiliates from all present and future liability and responsibility for any injury or death to persons or damages to property caused by or related to the use of the [employer]. I have read this Waiver and Release of Liability, and I understand the terms used in it and their legal significance. This Waiver is freely and voluntarily given with the understanding that my right (as well as the right of any minor child for whom I am the parent or legal guardian using the [employer]) to legal recourse against [facility] in connection with the [employer] is knowingly surrendered in return for use of these services.   I attest that I read the consent document to Aminta Dellia Beckwith, gave Ms. Perez-Ramirez the opportunity to ask questions and answered the questions asked (if any). I affirm that Alwilda Dellia Beckwith then provided consent for she's participation in this program.     Launa Grill, read to patient in [place]

## 2022-09-16 IMAGING — US US MFM FETAL BPP W/O NON-STRESS
1 series · 15 of 28 positions shown · non-contrast
Comparison: none

[Series 1: us mfm fetal bpp w/o non-stress · 34 acquisitions, 15 frames shown]
[im 1/34]
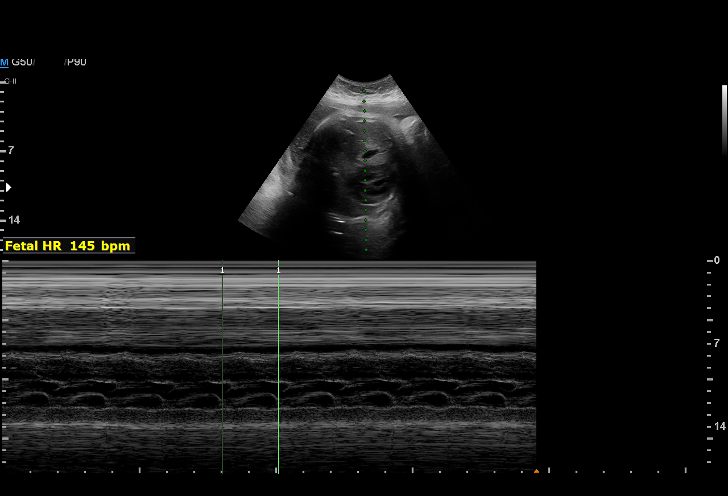
[im 3/34]
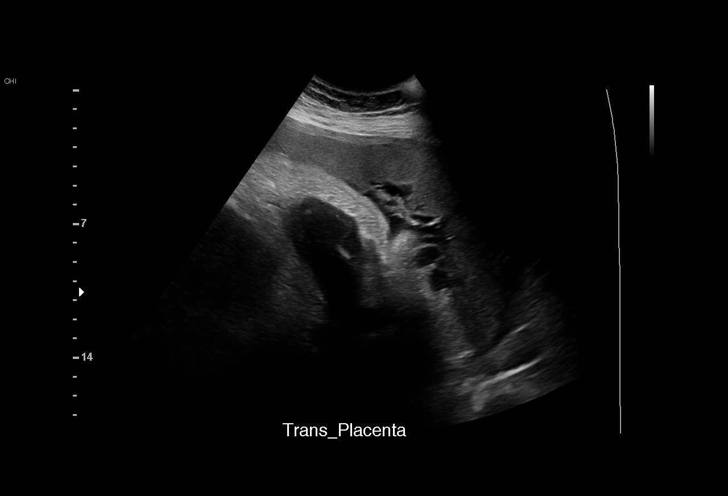
[im 5/34]
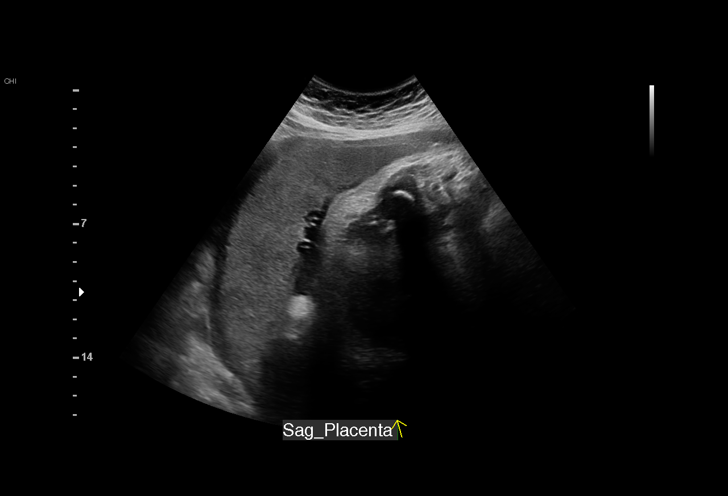
[im 8/34]
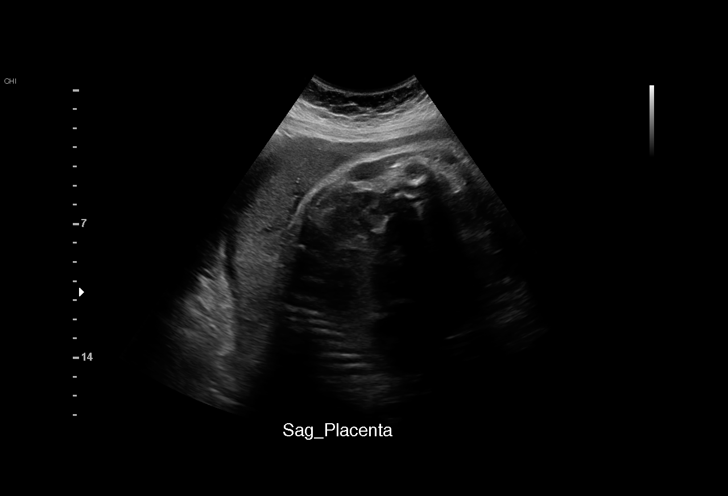
[im 10/34]
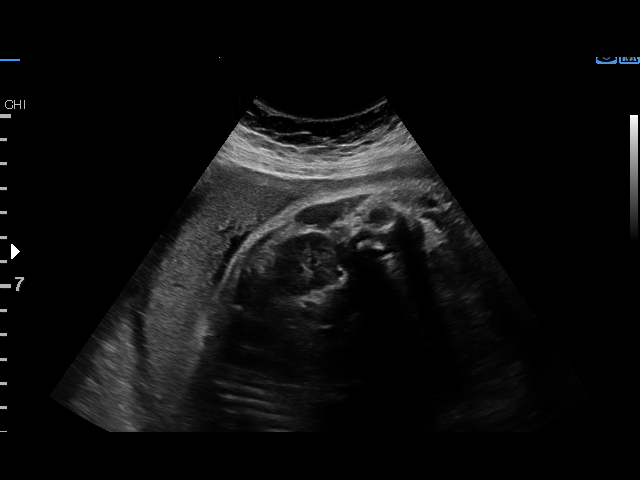
[im 13/34]
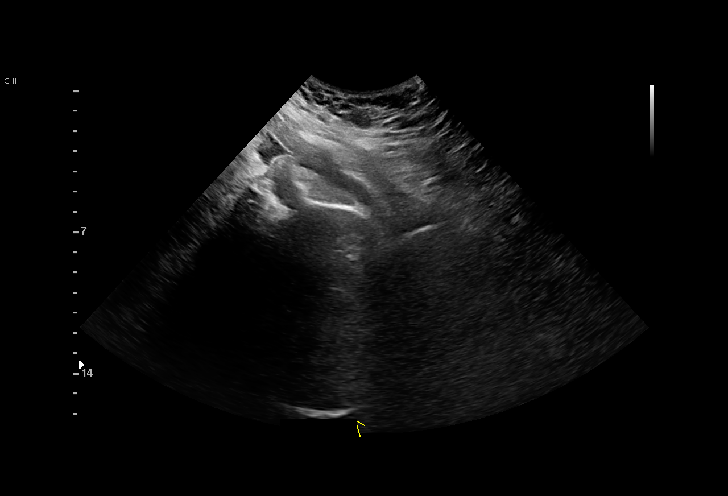
[im 15/34]
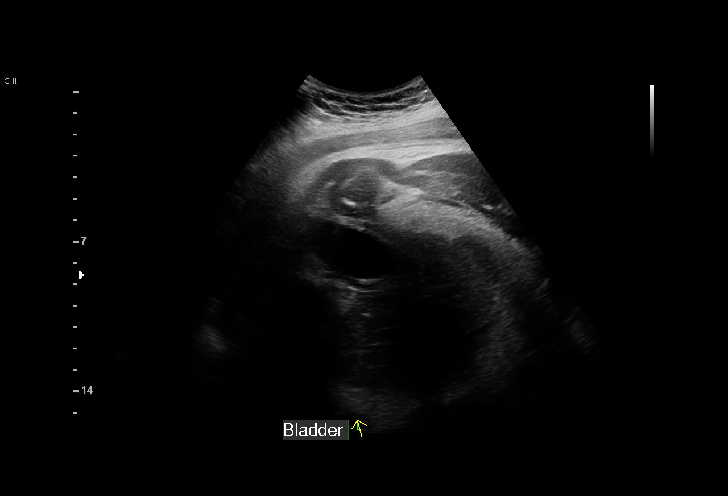
[im 18/34]
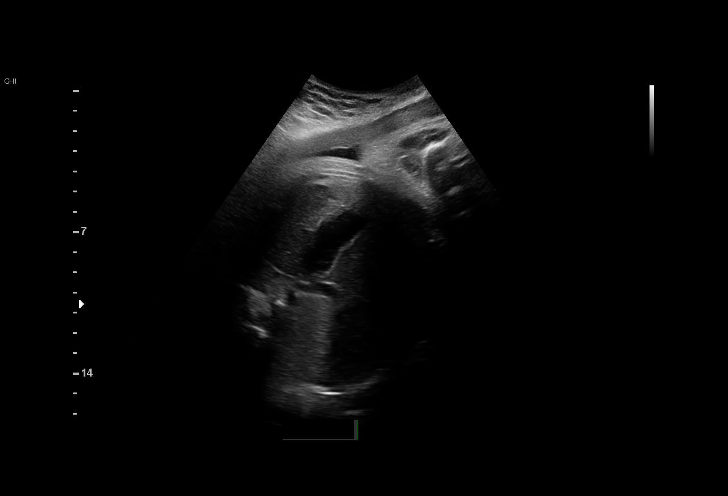
[im 19/34]
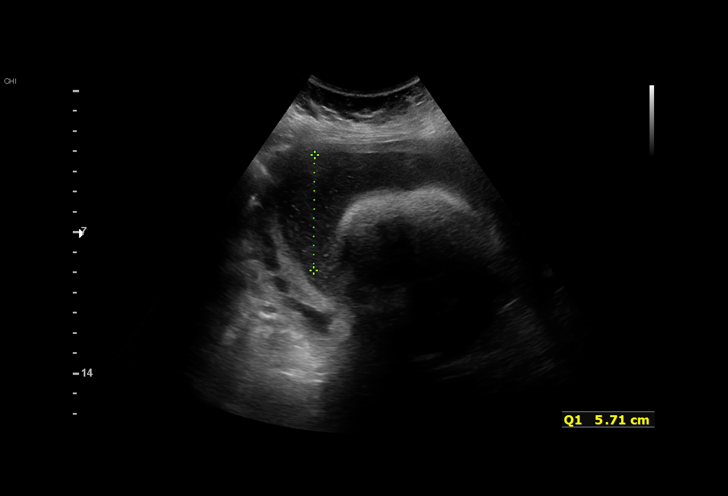
[im 21/34]
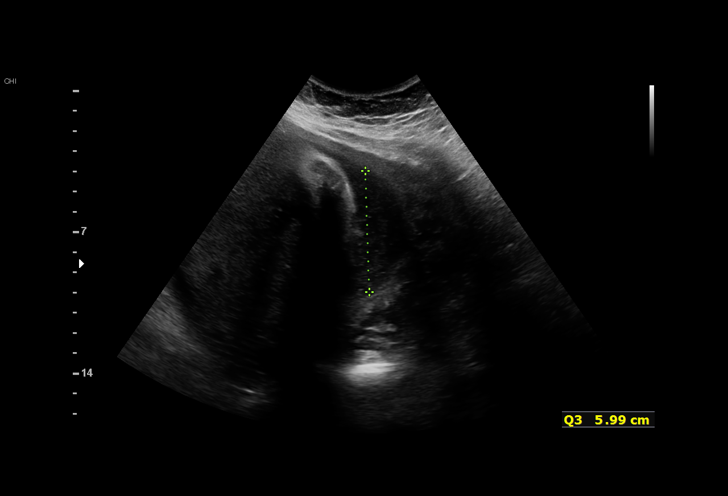
[im 24/34]
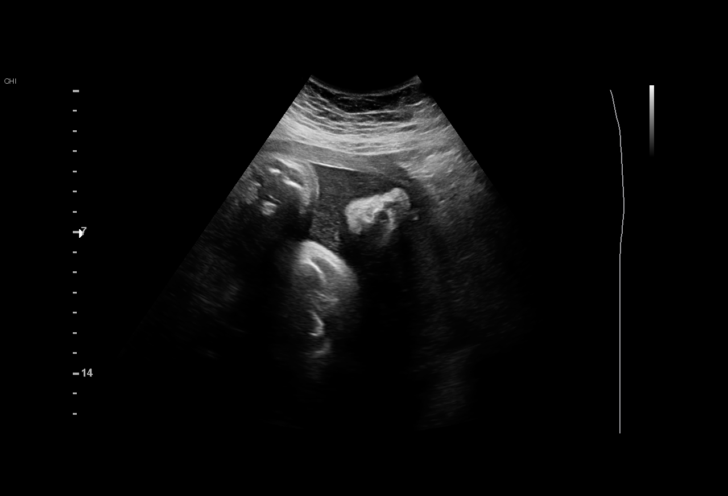
[im 26/34]
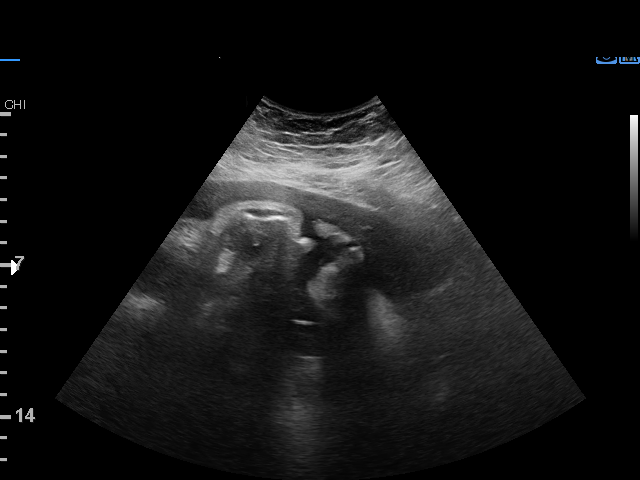
[im 29/34]
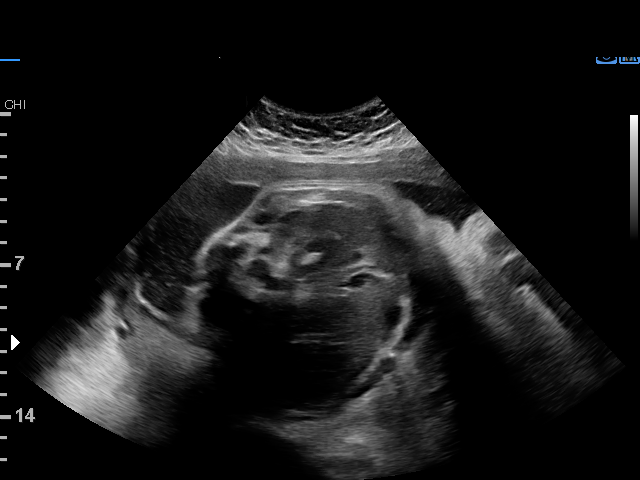
[im 31/34]
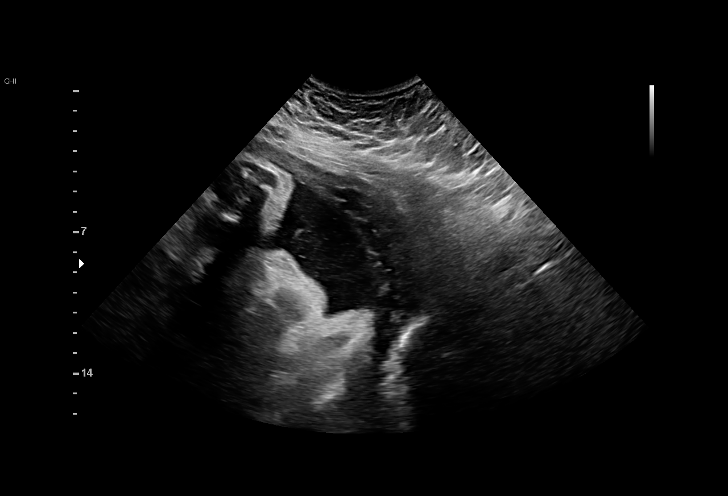
[im 34/34]
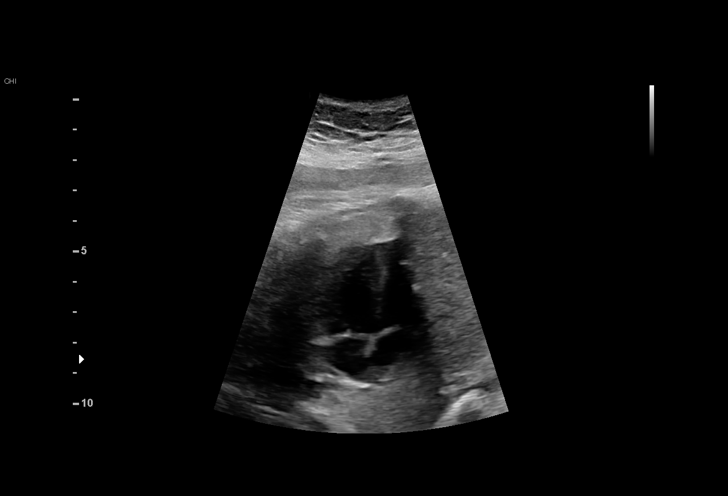

[15 of 28 positions shown; findings below may reference images not displayed]

ESAU


Indications

 Decreased fetal movements, third trimester,
 unspecified
 Gestational diabetes in pregnancy,
 controlled by oral hypoglycemic drugs
 (Metformin)
 Insufficient Prenatal Care
 Encounter for antenatal screening for
 malformations
 Previous cesarean delivery, antepartum x 2
 Obesity complicating pregnancy, third
 trimester ([place]reg[place] BMI 35)
 38 weeks gestation of pregnancy
Vital Signs

                                                Height:        5'6"
Fetal Evaluation

 Num Of Fetuses:         1
 Fetal Heart Rate(bpm):  145
 Cardiac Activity:       Observed
 Presentation:           Cephalic
 Placenta:               Posterior Fundal
 P. Cord Insertion:      Previously Visualized
 Amniotic Fluid
 AFI FV:      Within normal limits

 AFI Sum(cm)     %Tile       Largest Pocket(cm)
 17.65           70

 RUQ(cm)       RLQ(cm)       LUQ(cm)        LLQ(cm)

Biophysical Evaluation

 Amniotic F.V:   Pocket => 2 cm             F. Tone:        Observed
 F. Movement:    Observed                   Score:          [DATE]
 F. Breathing:   Observed
Biometry

 LV:        4.4  mm
OB History

 Gravidity:    3         Term:   2        Prem:   0        SAB:   0
 TOP:          0       Ectopic:  0        Living: 2
Gestational Age

 LMP:           35w 1d        Date:  07/30/19                 EDD:   05/05/20
 Best:          38w 2d     Det. By:  Previous Ultrasound      EDD:   04/13/20
                                     (12/17/19)
Anatomy

 Ventricles:            Appears normal         Kidneys:                Appear normal
 Heart:                 Appears normal         Bladder:                Appears normal
                        (4CH, axis, and
                        situs)
 Stomach:               Appears normal, left
                        sided
Impression

 Patient return for antenatal testing.  She has gestational
 diabetes and takes metformin for control.  She reported
 decreased fetal movements this morning.

 Amniotic fluid is normal and good fetal activity is seen
 .Antenatal testing is reassuring. BPP [DATE].

 Patient has an appointment at your office today after
 ultrasound and will be having NST at your office.
Recommendations

 No follow-up appointments were ma[REDACTED]

## 2023-01-14 ENCOUNTER — Other Ambulatory Visit: Payer: Self-pay

## 2023-01-14 ENCOUNTER — Emergency Department (HOSPITAL_COMMUNITY): Payer: Self-pay

## 2023-01-14 ENCOUNTER — Encounter (HOSPITAL_COMMUNITY): Payer: Self-pay

## 2023-01-14 ENCOUNTER — Emergency Department (HOSPITAL_COMMUNITY)
Admission: EM | Admit: 2023-01-14 | Discharge: 2023-01-14 | Disposition: A | Discharge status: Home / Self Care | Payer: Self-pay | Attending: Emergency Medicine | Admitting: Emergency Medicine

## 2023-01-14 DIAGNOSIS — N83291 Other ovarian cyst, right side: Secondary | ICD-10-CM | POA: Insufficient documentation

## 2023-01-14 DIAGNOSIS — D72829 Elevated white blood cell count, unspecified: Secondary | ICD-10-CM | POA: Insufficient documentation

## 2023-01-14 DIAGNOSIS — D270 Benign neoplasm of right ovary: Secondary | ICD-10-CM

## 2023-01-14 DIAGNOSIS — R1011 Right upper quadrant pain: Secondary | ICD-10-CM

## 2023-01-14 LAB — URINALYSIS, ROUTINE W REFLEX MICROSCOPIC
Bacteria, UA: NONE SEEN
Bilirubin Urine: NEGATIVE
Glucose, UA: NEGATIVE mg/dL
Ketones, ur: NEGATIVE mg/dL
Nitrite: NEGATIVE
Protein, ur: NEGATIVE mg/dL
Specific Gravity, Urine: 1.018 (ref 1.005–1.030)
pH: 7 (ref 5.0–8.0)

## 2023-01-14 LAB — COMPREHENSIVE METABOLIC PANEL
ALT: 28 U/L (ref 0–44)
AST: 15 U/L (ref 15–41)
Albumin: 3.9 g/dL (ref 3.5–5.0)
Alkaline Phosphatase: 116 U/L (ref 38–126)
Anion gap: 9 (ref 5–15)
BUN: 11 mg/dL (ref 6–20)
CO2: 25 mmol/L (ref 22–32)
Calcium: 9.1 mg/dL (ref 8.9–10.3)
Chloride: 104 mmol/L (ref 98–111)
Creatinine, Ser: 0.52 mg/dL (ref 0.44–1.00)
GFR, Estimated: >60 mL/min (ref >60)
Glucose, Bld: 97 mg/dL (ref 70–99)
Potassium: 3.9 mmol/L (ref 3.5–5.1)
Sodium: 138 mmol/L (ref 135–145)
Total Bilirubin: 0.4 mg/dL (ref 0.3–1.2)
Total Protein: 8 g/dL (ref 6.5–8.1)

## 2023-01-14 LAB — CBC
HCT: 42.4 % (ref 36.0–46.0)
Hemoglobin: 13.6 g/dL (ref 12.0–15.0)
MCH: 27.5 pg (ref 26.0–34.0)
MCHC: 32.1 g/dL (ref 30.0–36.0)
MCV: 85.8 fL (ref 80.0–100.0)
Platelets: 344 10*3/uL (ref 150–400)
RBC: 4.94 MIL/uL (ref 3.87–5.11)
RDW: 12.4 % (ref 11.5–15.5)
WBC: 13.6 10*3/uL — ABNORMAL HIGH (ref 4.0–10.5)
nRBC: 0 % (ref 0.0–0.2)

## 2023-01-14 LAB — LIPASE, BLOOD: Lipase: 29 U/L (ref 11–51)

## 2023-01-14 LAB — HCG, SERUM, QUALITATIVE: Preg, Serum: NEGATIVE

## 2023-01-14 MED ORDER — IOHEXOL 350 MG/ML SOLN
75.0000 mL | Freq: Once | INTRAVENOUS | Status: AC | PRN
Start: 2023-01-14 — End: 2023-01-14
  Administered 2023-01-14: 75 mL via INTRAVENOUS

## 2023-01-14 MED ORDER — HYDROMORPHONE HCL 1 MG/ML IJ SOLN
0.5000 mg | Freq: Once | INTRAMUSCULAR | Status: AC
  Administered 2023-01-14: 0.5 mg via INTRAVENOUS
  Filled 2023-01-14 (×2): qty 1

## 2023-01-14 NOTE — ED Provider Notes (Signed)
[place] EMERGENCY DEPARTMENT AT [facility] Provider Note  CSN: [id number] Arrival date & time: 01/14/23 0048  Chief Complaint(s) Abdominal Pain  HPI Shelley Wiggins is a 27 y.o. female     Abdominal Pain Pain location:  RUQ Pain quality: aching, fullness and sharp   Pain radiates to:  R flank Pain severity:  Moderate Duration:  5 days Timing:  Constant Progression:  Waxing and waning Chronicity:  New Relieved by:  Nothing Worsened by:  Movement, palpation and eating Associated symptoms: nausea   Associated symptoms: no chest pain, no constipation, no diarrhea, no fever, no shortness of breath and no vomiting     Past Medical History Past Medical History:  Diagnosis Date   Calcium deficiency    Gestational diabetes    Trouble breathing    pt states this happens at random times since she was a child, not asthma related   Patient Active Problem List   Diagnosis Date Noted   COVID-19 affecting pregnancy in third trimester 04/08/2020   Previous cesarean section complicating pregnancy, antepartum condition or complication 04/06/2020   LGA (large for gestational age) fetus affecting management of mother 03/26/2020   Family history of autism 03/26/2020   Gestational diabetes mellitus (GDM), antepartum 03/04/2020   Supervision of high risk pregnancy, antepartum 02/28/2020   Language barrier 01/26/2018   Late prenatal care affecting pregnancy, antepartum, third trimester 01/26/2018   H/O cesarean section 2017   Home Medication(s) Prior to Admission medications   Medication Sig Start Date End Date Taking? Authorizing Provider  acetaminophen (TYLENOL) 325 MG tablet Take 2 tablets (650 mg total) by mouth every 6 (six) hours as needed for mild pain, moderate pain, fever or headache (temperature > 101.5.). Patient not taking: Reported on 04/17/2020 04/08/20   Sheila Oats, MD  coconut oil OIL Apply 1 application topically as needed (nipple  pain). Patient not taking: Reported on 04/17/2020 04/08/20   Sheila Oats, MD  ferrous sulfate 325 (65 FE) MG tablet Take 1 tablet (325 mg total) by mouth every other day. Patient not taking: Reported on 04/17/2020 04/09/20   Sheila Oats, MD  ibuprofen (ADVIL) 800 MG tablet Take 1 tablet (800 mg total) by mouth every 8 (eight) hours as needed for moderate pain or cramping. 04/08/20   Sheila Oats, MD  oxyCODONE (OXY IR/ROXICODONE) 5 MG immediate release tablet Take 1-2 tablets (5-10 mg total) by mouth every 6 (six) hours as needed for severe pain or breakthrough pain. Patient not taking: Reported on 04/17/2020 04/08/20   Sheila Oats, MD  prenatal vitamin w/FE, FA (PRENATAL 1 + 1) 27-1 MG TABS tablet Take 1 tablet by mouth daily at 12 noon. Patient not taking: Reported on 04/17/2020    [provider]  Allergies Patient has no known allergies.  Review of Systems Review of Systems  Constitutional:  Negative for fever.  Respiratory:  Negative for shortness of breath.   Cardiovascular:  Negative for chest pain.  Gastrointestinal:  Positive for abdominal pain and nausea. Negative for constipation, diarrhea and vomiting.   As noted in HPI  Physical Exam Vital Signs  I have reviewed the triage vital signs BP (!) 90/53   Pulse 91   Temp 98.3 F (36.8 C)   Resp 16   Ht 5\' 6"  (1.676 m)   Wt 99.8 kg   SpO2 96%   BMI 35.51 kg/m   Physical Exam Vitals reviewed.  Constitutional:      General: She is not in acute distress.    Appearance: She is well-developed. She is obese. She is not diaphoretic.  HENT:     Head: Normocephalic and atraumatic.     Right Ear: External ear normal.     Left Ear: External ear normal.     Nose: Nose normal.  Eyes:     General: No scleral icterus.    Conjunctiva/sclera: Conjunctivae normal.  Neck:     Trachea:  Phonation normal.  Cardiovascular:     Rate and Rhythm: Normal rate and regular rhythm.  Pulmonary:     Effort: Pulmonary effort is normal. No respiratory distress.     Breath sounds: No stridor.  Abdominal:     General: There is no distension.     Tenderness: There is abdominal tenderness in the right upper quadrant. There is no right CVA tenderness, left CVA tenderness, guarding or rebound. Positive signs include Murphy's sign. Negative signs include McBurney's sign.     Hernia: No hernia is present.  Musculoskeletal:        General: Normal range of motion.     Cervical back: Normal range of motion.  Neurological:     Mental Status: She is alert and oriented to person, place, and time.  Psychiatric:        Behavior: Behavior normal.     ED Results and Treatments Labs (all labs ordered are listed, but only abnormal results are displayed) Labs Reviewed  CBC - Abnormal; Notable for the following components:      Result Value   WBC 13.6 (*)    All other components within normal limits  URINALYSIS, ROUTINE W REFLEX MICROSCOPIC - Abnormal; Notable for the following components:   Hgb urine dipstick SMALL (*)    Leukocytes,Ua TRACE (*)    All other components within normal limits  LIPASE, BLOOD  COMPREHENSIVE METABOLIC PANEL  HCG, SERUM, QUALITATIVE                                                                                                                         EKG  EKG Interpretation Date/Time:    Ventricular Rate:    PR Interval:    QRS Duration:    QT Interval:    QTC Calculation:   R  Axis:      Text Interpretation:         Radiology CT ABDOMEN PELVIS W CONTRAST  Result Date: 01/14/2023 CLINICAL DATA:  Appendicitis suspected.  Right-sided abdominal pain EXAM: CT ABDOMEN AND PELVIS WITH CONTRAST TECHNIQUE: Multidetector CT imaging of the abdomen and pelvis was performed using the standard protocol following bolus administration of intravenous contrast.  RADIATION DOSE REDUCTION: This exam was performed according to the departmental dose-optimization program which includes automated exposure control, adjustment of the mA and/or kV according to patient size and/or use of iterative reconstruction technique. CONTRAST:  75mL OMNIPAQUE IOHEXOL 350 MG/ML SOLN COMPARISON:  None Available. FINDINGS: Lower chest:  No contributory findings. Hepatobiliary: No focal liver abnormality.No evidence of biliary obstruction or stone. Pancreas: Unremarkable. Spleen: Unremarkable. Adrenals/Urinary Tract: Negative adrenals. No hydronephrosis or stone. Unremarkable bladder. Stomach/Bowel:  No obstruction. No appendicitis. Vascular/Lymphatic: No acute vascular abnormality. No mass or adenopathy. Reproductive:Fatty mass at the anterior aspect of the right ovary measuring 11 mm. No ovarian thickening. Other: No ascites or pneumoperitoneum. Musculoskeletal: No acute abnormalities. IMPRESSION: 1. No acute finding.  No appendicitis. 2. 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 Electronically Signed   By: Tiburcio Pea M.D.   On: 01/14/2023 05:58   US Abdomen Limited RUQ (LIVER/GB)  Result Date: 01/14/2023 CLINICAL DATA:  Right upper quadrant pain EXAM: ULTRASOUND ABDOMEN LIMITED RIGHT UPPER QUADRANT COMPARISON:  None Available. FINDINGS: Gallbladder: No gallstones or wall thickening visualized. No sonographic Murphy sign noted by sonographer. Common bile duct: Diameter: 3.3 mm Liver: Echogenic liver parenchyma. No focal hepatic abnormality. Portal vein is patent on color Doppler imaging with normal direction of blood flow towards the liver. Other: None. IMPRESSION: Negative for gallstones. Echogenic liver parenchyma consistent with hepatic steatosis and or hepatocellular disease. Electronically Signed   By: Jasmine Pang M.D.   On: 01/14/2023 04:11    Medications Ordered in ED Medications  HYDROmorphone (DILAUDID) injection 0.5 mg (0.5 mg Intravenous Given 01/14/23 0336)  iohexol (OMNIPAQUE) 350  MG/ML injection 75 mL (75 mLs Intravenous Contrast Given 01/14/23 0507)   Procedures Procedures  (including critical care time) Medical Decision Making / ED Course   Medical Decision Making Amount and/or Complexity of Data Reviewed Labs: ordered. Decision-making details documented in ED Course. Radiology: ordered and independent interpretation performed. Decision-making details documented in ED Course.  Risk Prescription drug management.    Right upper quadrant pain workup for differential diagnoses listed below  CBC with leukocytosis.  No anemia. CMP without significant electrolyte derangements or renal sufficiency.  No evidence of bili obstruction or pancreatitis. UA without evidence of infection ruling out pyelonephritis. hCG negative ruling out pregnancy related process. Right upper quadrant ultrasound negative for evidence of biliary disease. CT negative for any acute intra-abdominal inflammatory/infectious processes.  Possible functional intestinal pain versus bloating.    Final Clinical Impression(s) / ED Diagnoses Final diagnoses:  RUQ abdominal pain  Cyst, ovary, dermoid, right   The patient appears reasonably screened and/or stabilized for discharge and I doubt any other medical condition or other [facility] requiring further screening, evaluation, or treatment in the ED at this time. I have discussed the findings, Dx and Tx plan with the patient/family who expressed understanding and agree(s) with the plan. Discharge instructions discussed at length. The patient/family was given strict return precautions who verbalized understanding of the instructions. No further questions at time of discharge.  Disposition: Discharge  Condition: Good  ED Discharge Orders     None        Follow  Up: Primary care provider  Call  to schedule an appointment for close follow up  Gynecology  Call  as needed    This chart was dictated using voice recognition software.   Despite best efforts to proofread,  errors can occur which can change the documentation meaning.    Nira Conn, MD 01/14/23 [phone]

## 2023-01-14 NOTE — ED Triage Notes (Signed)
Pt is coming in with RUQ pain that she has been experiencing for around 5 days now, she states it is an inflammatory type pain she is feeling, states having extreme drowsiness as well with the pain in the RUQ. States she has experienced some chills at home as well.
